# Patient Record
Sex: Male | Born: 1985 | Race: White | Hispanic: No | Marital: Single | Smoking: Never smoker
Health system: Southern US, Community
[De-identification: ages and names within clinical notes are randomized; demographics above are authoritative.]

## PROBLEM LIST (undated history)

## (undated) DIAGNOSIS — I340 Nonrheumatic mitral (valve) insufficiency: Secondary | ICD-10-CM

## (undated) DIAGNOSIS — Q231 Congenital insufficiency of aortic valve: Secondary | ICD-10-CM

## (undated) DIAGNOSIS — I080 Rheumatic disorders of both mitral and aortic valves: Secondary | ICD-10-CM

## (undated) HISTORY — DX: Rheumatic disorders of both mitral and aortic valves: I08.0

## (undated) HISTORY — DX: Congenital insufficiency of aortic valve: Q23.1

## (undated) HISTORY — PX: HERNIA REPAIR: SHX51

## (undated) HISTORY — DX: Nonrheumatic mitral (valve) insufficiency: I34.0

## (undated) HISTORY — PX: OTHER SURGICAL HISTORY: SHX169

---
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
## 2014-07-16 HISTORY — PX: APPENDECTOMY: SHX54

## 2016-12-21 ENCOUNTER — Encounter: Payer: Self-pay | Admitting: Cardiology

## 2016-12-25 DIAGNOSIS — Q249 Congenital malformation of heart, unspecified: Secondary | ICD-10-CM | POA: Insufficient documentation

## 2017-01-10 ENCOUNTER — Ambulatory Visit: Payer: Self-pay | Admitting: Cardiology

## 2017-02-22 ENCOUNTER — Ambulatory Visit (INDEPENDENT_AMBULATORY_CARE_PROVIDER_SITE_OTHER): Payer: BLUE CROSS/BLUE SHIELD | Admitting: Interventional Cardiology

## 2017-02-22 ENCOUNTER — Encounter: Payer: Self-pay | Admitting: Interventional Cardiology

## 2017-02-22 ENCOUNTER — Encounter (INDEPENDENT_AMBULATORY_CARE_PROVIDER_SITE_OTHER): Payer: Self-pay

## 2017-02-22 VITALS — BP 114/72 | HR 81 | Ht 71.0 in | Wt 177.2 lb

## 2017-02-22 DIAGNOSIS — I340 Nonrheumatic mitral (valve) insufficiency: Secondary | ICD-10-CM | POA: Diagnosis not present

## 2017-02-22 DIAGNOSIS — Q231 Congenital insufficiency of aortic valve: Secondary | ICD-10-CM

## 2017-02-22 DIAGNOSIS — Z8774 Personal history of (corrected) congenital malformations of heart and circulatory system: Secondary | ICD-10-CM | POA: Diagnosis not present

## 2017-02-22 HISTORY — DX: Nonrheumatic mitral (valve) insufficiency: I34.0

## 2017-02-22 HISTORY — DX: Congenital insufficiency of aortic valve: Q23.1

## 2017-02-22 NOTE — Progress Notes (Addendum)
Cardiology Office Note    Date:  02/22/2017   ID:  Edgar SimmersMatthew B Killmer, DOB 09/11/1985, MRN [id number]  PCP:  Farris HasMorrow, Aaron, MD  Cardiologist: Lesleigh NoeHenry W Smith III, MD   Chief Complaint  Patient presents with  . Follow-up    Coarctation of the aorta    History of Present Illness:  Edgar Dean is a 31 y.o. male referred by Dr. Farris HasAaron Morrow for establishment and long-term care with history of congenital Coarctation of the aorta repaired in 1989. 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  He is doing well. He has had intermittent follow-up after coarctation repair when he was 742-1/32 years old. He has no complaints. He is physically active. He has relocated back to [place] where he was born and raised. He was diagnosed and treated by Dr. Fletcher AnonStuart Schall. After graduating [employer], he has lived in [place] with his wife. He is establishing with cardiology to have routine follow-up. Last imaging was done in the [place] area at [facility] [address], [address], [place], [place] [id number]. He has a disc with a 2016 aortic MRI from that institution.  Past Medical History:  Diagnosis Date  . Rheumatic valve narrowing and leaking 1986   leaking valve - tricupsid    Past Surgical History:  Procedure Laterality Date  . APPENDECTOMY  2016  . HERNIA REPAIR     10 grade  . leaking valve - tricupsid (1986)      Current Medications: No outpatient prescriptions prior to visit.   No facility-administered medications prior to visit.      Allergies:   Patient has no known allergies.   Social History   Social History  . Marital status: Single    Spouse name: N/A  . Number of children: N/A  . Years of education: N/A   Social History Main Topics  . Smoking status: Never Smoker  . Smokeless tobacco: Never Used  . Alcohol use Yes     Comment: 5+ wine, beer or liquor  . Drug use: No   Comment: college  . Sexual activity: Not Asked   Other Topics Concern  . None   Social History Narrative  . None     Family History:  The patient's family history includes Healthy in his brother and father; Other in his mother.   ROS:   Please see the history of present illness.    No history of syncope, chest pain, leg fatigue, or neurological events. He has an occasional palpitation and nocturnal bilateral leg cramping in the calves. All other systems reviewed and are negative.   PHYSICAL EXAM:   VS:  BP 114/72 (BP Location: Left Arm)   Pulse 81   Ht 5\' 11"  (1.803 m)   Wt 177 lb 3.2 oz (80.4 kg)   BMI 24.71 kg/m    GEN: Well nourished, well developed, in no acute distress  HEENT: normal  Neck: no JVD, carotid bruits, or masses Cardiac: RRR; there is a 1/6 systolic  Murmur at the left lower sternal border. No rubs, or gallops,no edema .  Respiratory:  clear to auscultation bilaterally, normal work of breathing. Left lateral chest incision from the anterior axillary line to the scapula, well healed.  GI: soft, nontender, nondistended, + BS MS: no deformity or atrophy  Skin: warm and dry, no rash Neuro:  Alert and Oriented x 3, Strength and sensation are intact Psych: euthymic mood, full affect  Wt Readings from Last 3 Encounters:  02/22/17 177 lb 3.2 oz (80.4 kg)      Studies/Labs Reviewed:   EKG:  EKG  Normal sinus rhythm and overall normal appearance. Possibility of left atrial abnormality.  Recent Labs: No results found for requested labs within last 8760 hours.   Lipid Panel No results found for: CHOL, TRIG, HDL, CHOLHDL, VLDL, LDLCALC, LDLDIRECT  Additional studies/ records that were reviewed today include:   Will obtain recent imaging data from his physician in [place].  Blood pressure left arm 105/62 mmHg. Blood pressure right arm 120/70 mmHg.  Aortic MRA performed in [place] March 2016 demonstrated aortic root of 3.1 cm, abnormal morphology  of the large distal to the left subclavian ostium consistent with prior aortic coarctation repair.  Stress echo done April 2016 demonstrated 2 mm of inferolateral ST segment depression, upsloping, with normal LVEF during exercise and post exercise.  ASSESSMENT:    1. History of repair of coarctation of aorta   2. Bicuspid aortic valve   3. Non-rheumatic mitral regurgitation   4. Congenital heart anomaly      PLAN:  In order of problems listed above:  1. He is status post primary repair of aortic coarctation at age 32-1/2. He has been stable since that time. Secondary severe mitral regurgitation resolved to mild mitral regurgitation after successful repair. 2. Possible bicuspid aortic valve. He believes he has been told that before. We will await reports from prior echoes. 3. No significant mitral regurgitation is noted on exam.  1 year follow-up. Additional discussion at that time if any issues.  Medication Adjustments/Labs and Tests Ordered: Current medicines are reviewed at length with the patient today.  Concerns regarding medicines are outlined above.  Medication changes, Labs and Tests ordered today are listed in the Patient Instructions below. Patient Instructions  Medication Instructions:  None  Labwork: None  Testing/Procedures: None  Follow-Up: Your physician wants you to follow-up in: 1 year with Dr. Katrinka Blazing.  You will receive a reminder letter in the mail two months in advance. If you don't receive a letter, please call our office to schedule the follow-up appointment.   Any Other Special Instructions Will Be Listed Below (If Applicable).     If you need a refill on your cardiac medications before your next appointment, please call your pharmacy.      Signed, Lesleigh Noe, MD  02/22/2017 1:47 PM    [facility] [address] [place], [place], [place]  [address] Phone: [phone]; Fax: [phone]

## 2017-02-22 NOTE — Patient Instructions (Signed)

## 2017-03-24 ENCOUNTER — Encounter (HOSPITAL_COMMUNITY): Payer: Self-pay

## 2017-03-24 ENCOUNTER — Emergency Department (HOSPITAL_COMMUNITY)
Admission: EM | Admit: 2017-03-24 | Discharge: 2017-03-24 | Disposition: A | Discharge status: Home / Self Care | Payer: BLUE CROSS/BLUE SHIELD | Attending: Emergency Medicine | Admitting: Emergency Medicine

## 2017-03-24 ENCOUNTER — Emergency Department (HOSPITAL_COMMUNITY): Payer: BLUE CROSS/BLUE SHIELD

## 2017-03-24 DIAGNOSIS — Y998 Other external cause status: Secondary | ICD-10-CM | POA: Diagnosis not present

## 2017-03-24 DIAGNOSIS — W0110XA Fall on same level from slipping, tripping and stumbling with subsequent striking against unspecified object, initial encounter: Secondary | ICD-10-CM | POA: Insufficient documentation

## 2017-03-24 DIAGNOSIS — Y9389 Activity, other specified: Secondary | ICD-10-CM | POA: Insufficient documentation

## 2017-03-24 DIAGNOSIS — S43014A Anterior dislocation of right humerus, initial encounter: Secondary | ICD-10-CM | POA: Insufficient documentation

## 2017-03-24 DIAGNOSIS — Z8774 Personal history of (corrected) congenital malformations of heart and circulatory system: Secondary | ICD-10-CM | POA: Diagnosis not present

## 2017-03-24 DIAGNOSIS — S4991XA Unspecified injury of right shoulder and upper arm, initial encounter: Secondary | ICD-10-CM | POA: Diagnosis present

## 2017-03-24 DIAGNOSIS — Y92099 Unspecified place in other non-institutional residence as the place of occurrence of the external cause: Secondary | ICD-10-CM | POA: Insufficient documentation

## 2017-03-24 IMAGING — DX DG SHOULDER 2+V*R*
3 series · 3 of 3 positions shown · non-contrast
Comparison: None.

CLINICAL DATA: Right shoulder pain after fall.

EXAM:
RIGHT SHOULDER - 2+ VIEW

[shoulder grashey]
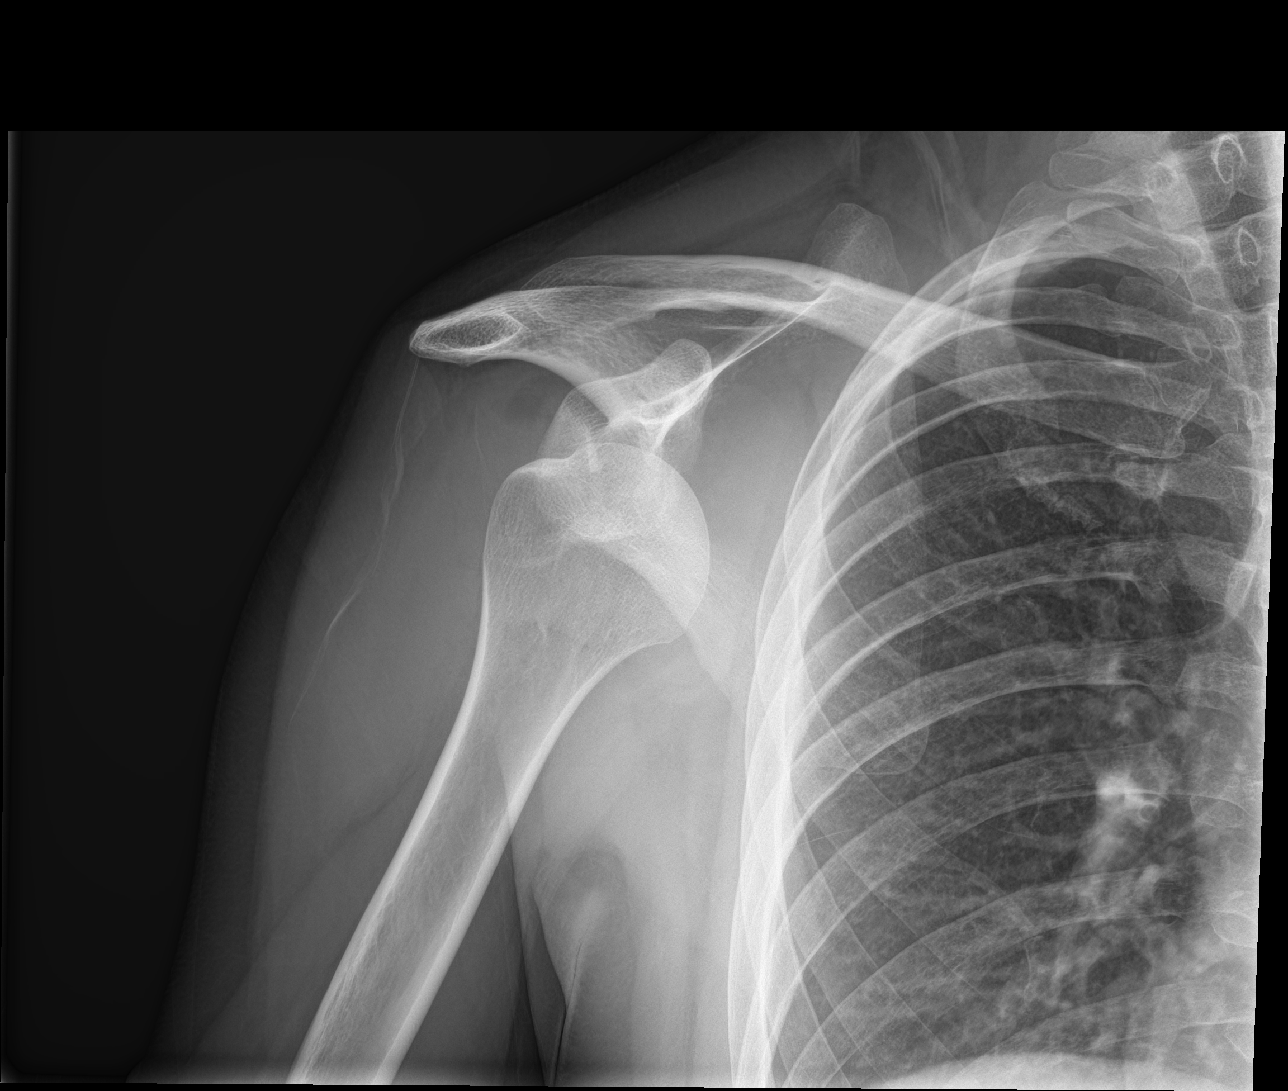

[shoulder y-view (1 of 2)]
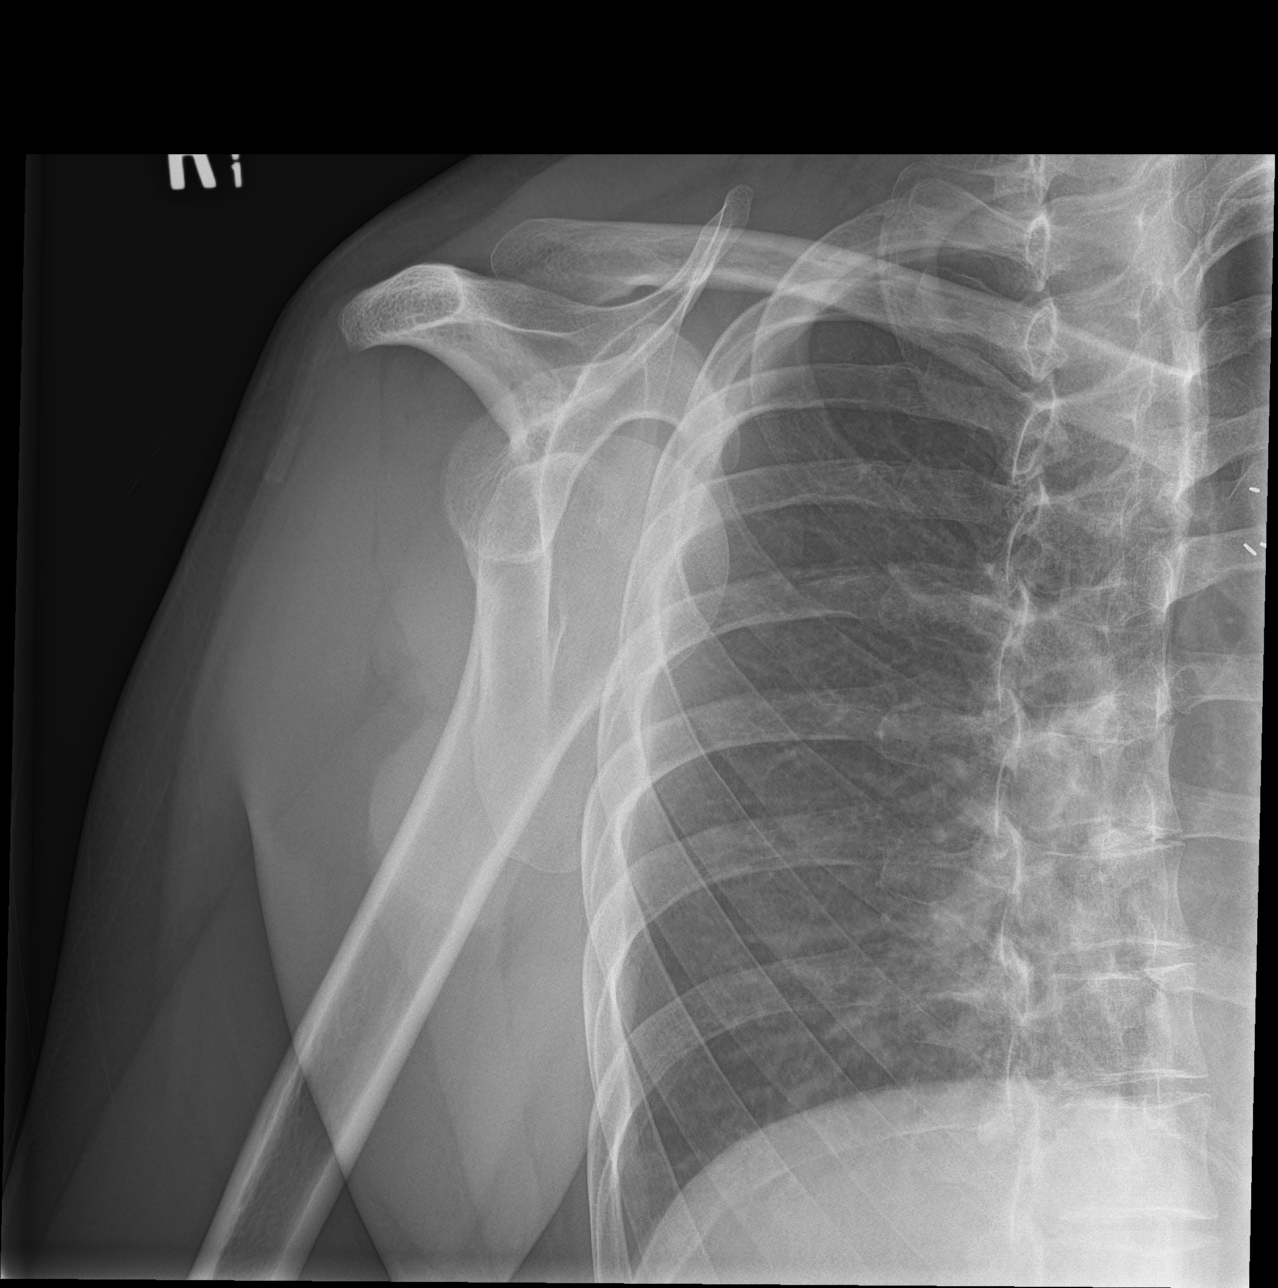

[shoulder y-view (2 of 2)]
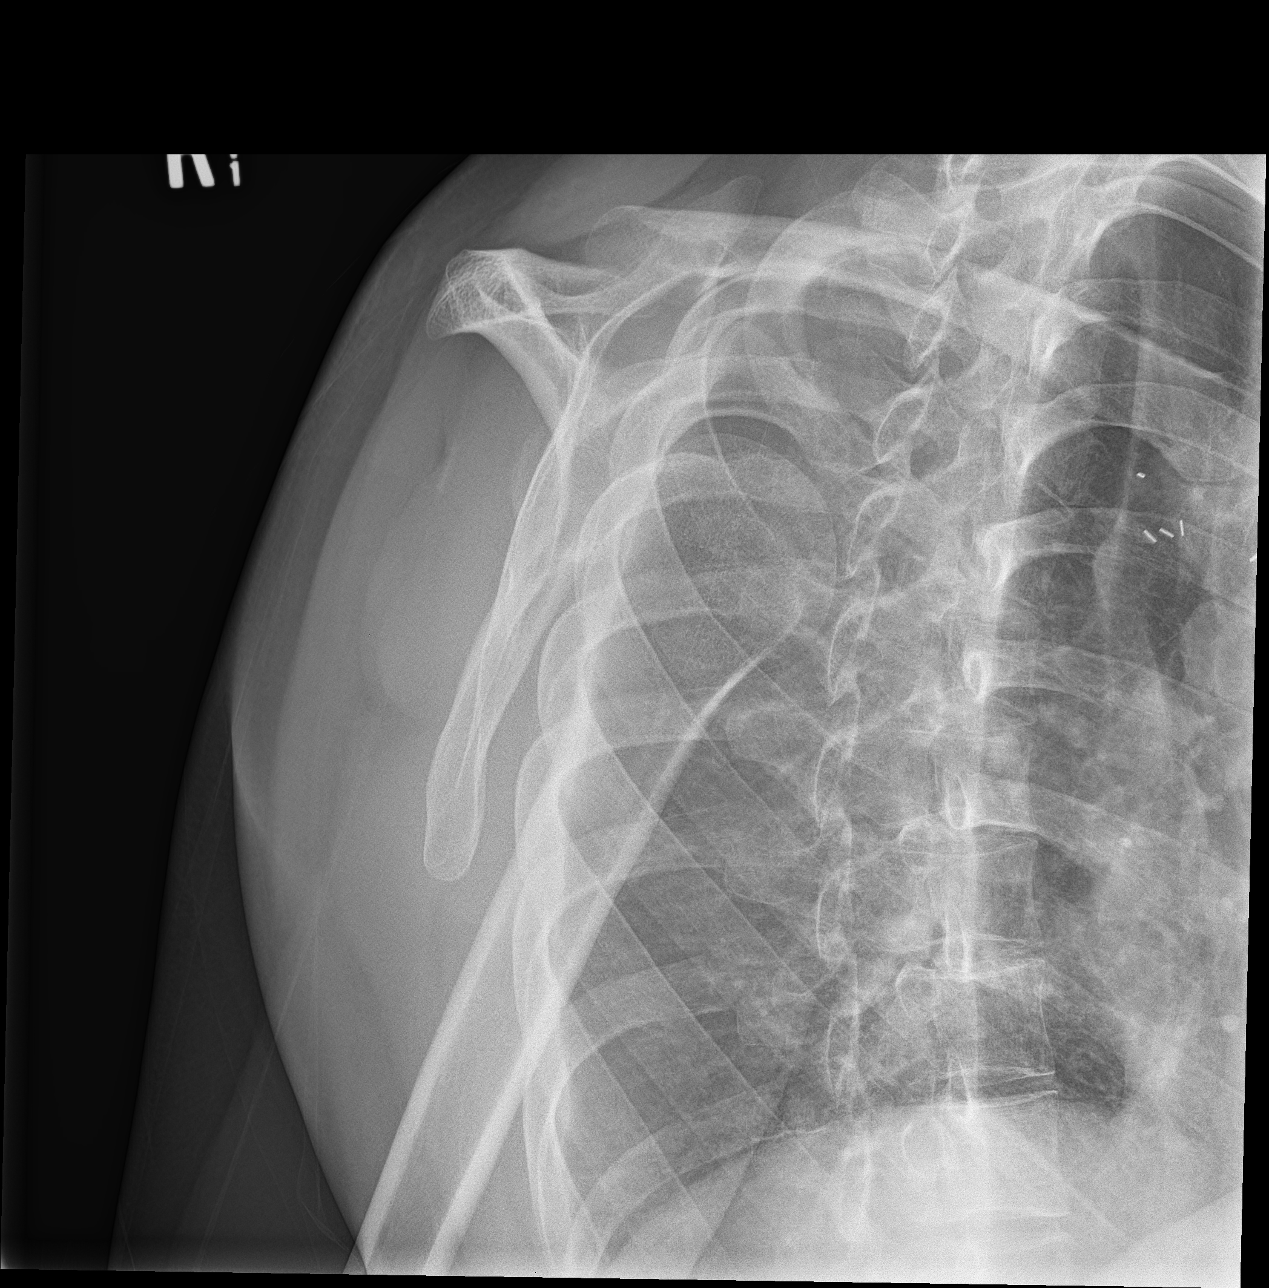

[3 of 3 positions shown; findings below may reference images not displayed]

FINDINGS: There is no evidence of fracture. Anterior dislocation of proximal
humeral head is noted. There is no evidence of arthropathy or other
focal bone abnormality. Soft tissues are unremarkable.
IMPRESSION: Anterior dislocation of proximal right humeral head.

## 2017-03-24 IMAGING — CR DG SHOULDER 2+V*R*
2 series · 2 of 2 positions shown · non-contrast
Comparison: Earlier today

CLINICAL DATA: Postreduction right shoulder films. Initial
encounter.

EXAM:
RIGHT SHOULDER - 2+ VIEW

[shoulder grashey]
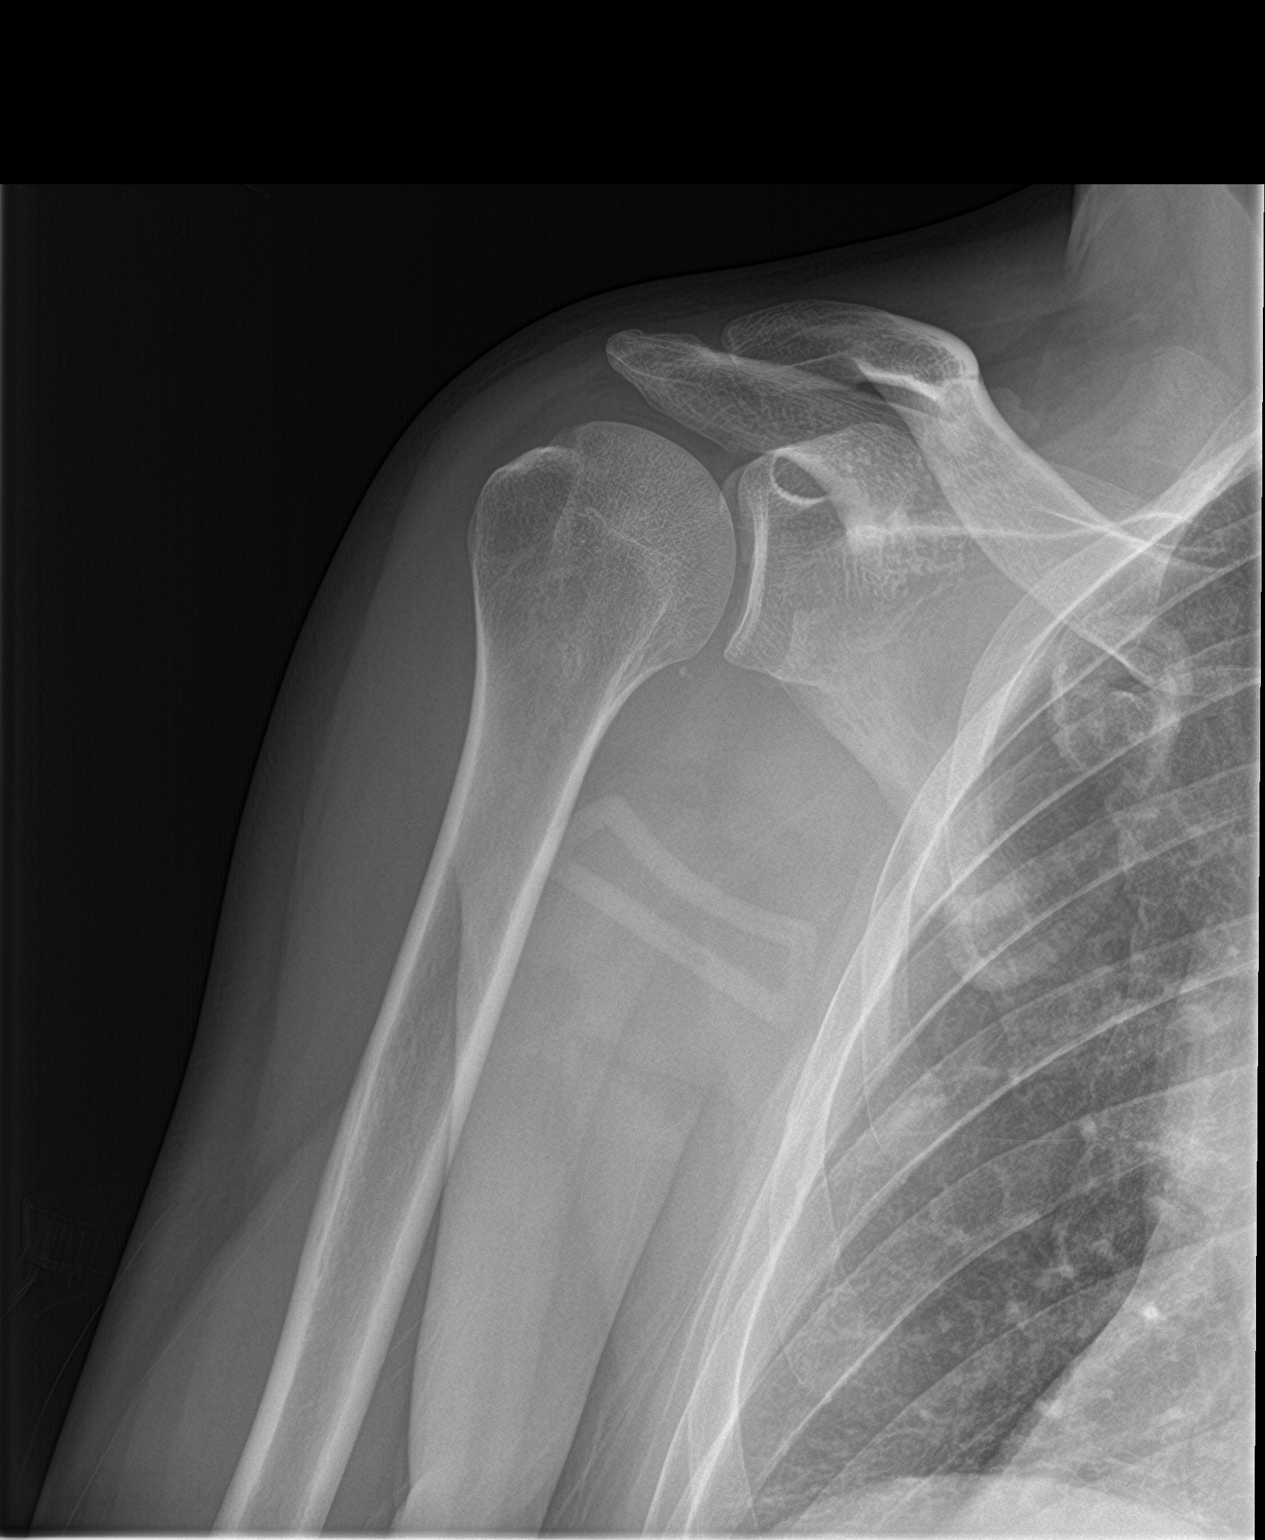

[shoulder y view]
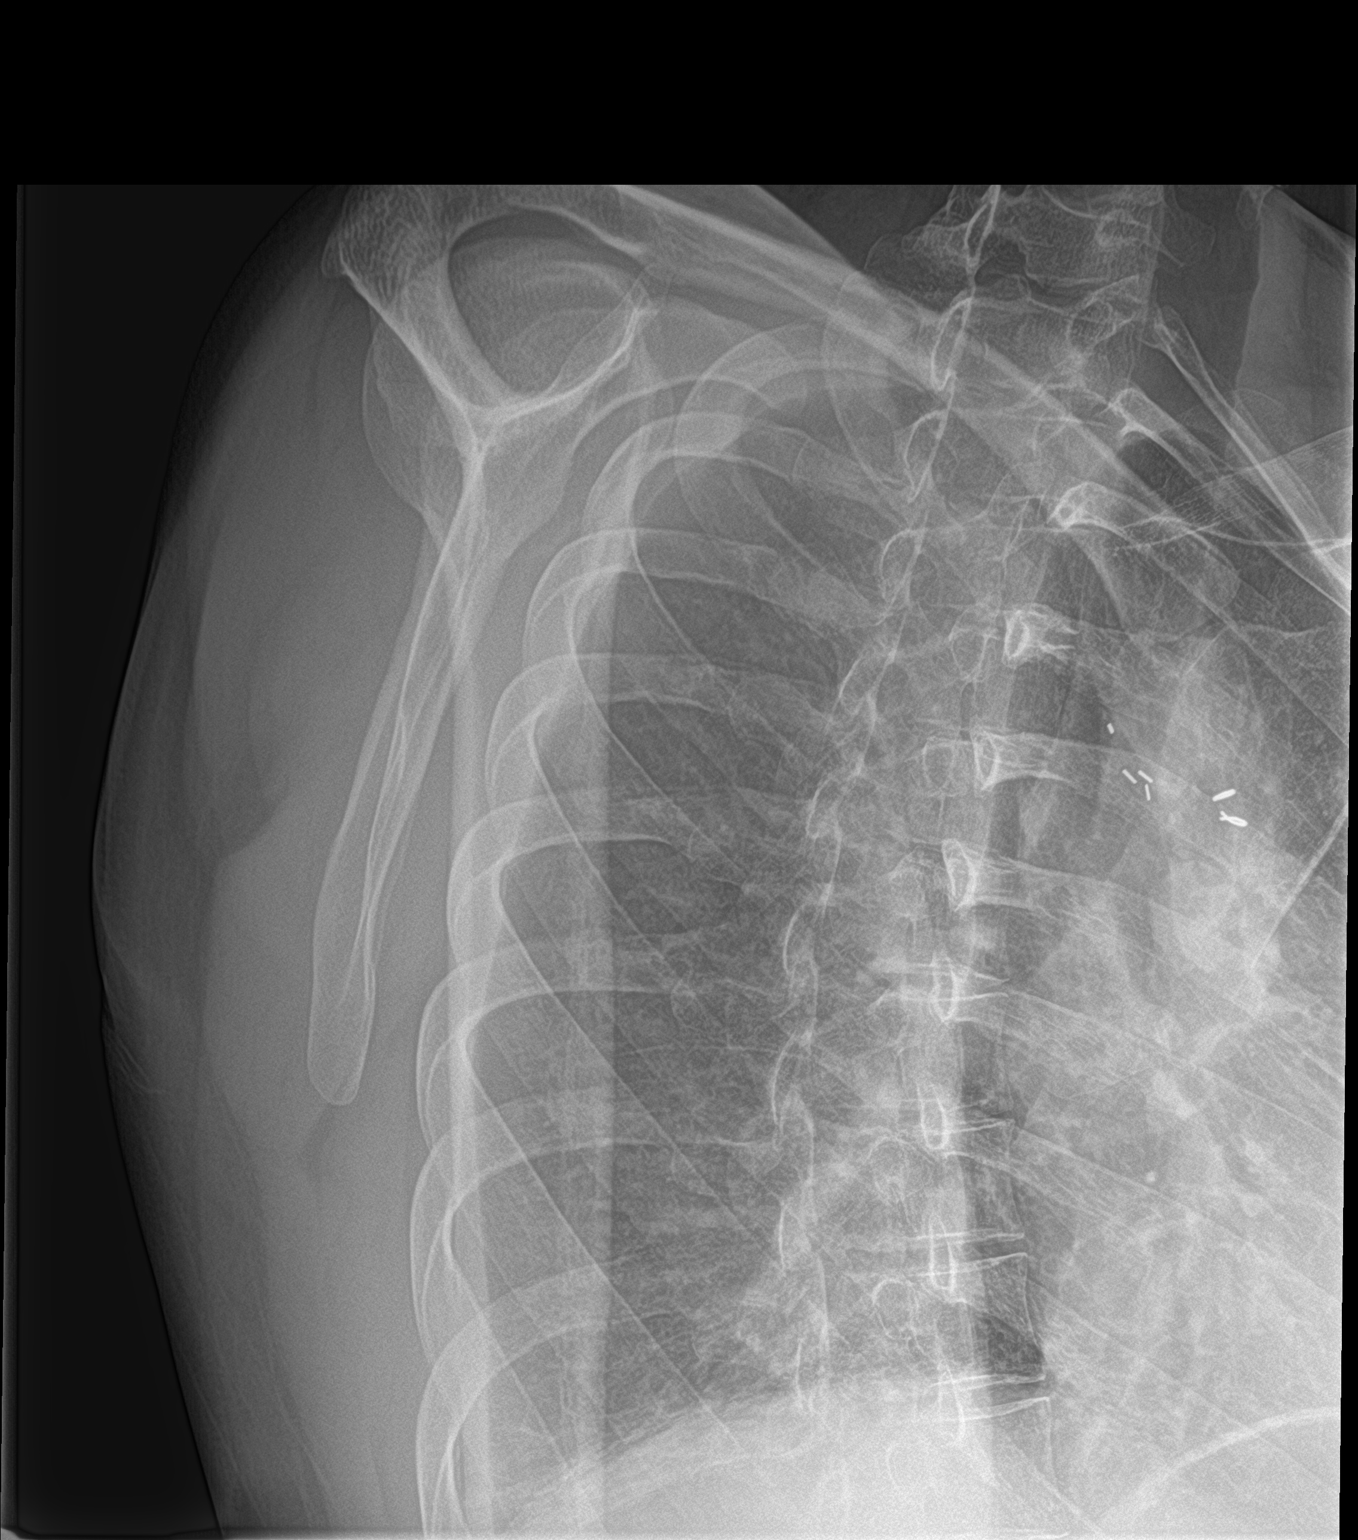

[2 of 2 positions shown; findings below may reference images not displayed]

FINDINGS: Right glenohumeral joint has been relocated. Hill-Sachs deformity is
present. Normal alignment at the acromioclavicular joint. There are
2 tiny radiodensities overlapping the inferior glenohumeral recess
without visible donor site.

Clips seen over the mediastinum; history of aortic coarctation
repair.
IMPRESSION: 1. Reduced glenohumeral joint.
2. Hill-Sachs deformity.

## 2017-03-24 MED ORDER — IBUPROFEN 800 MG PO TABS: 800.0000 mg | ORAL_TABLET | Freq: Three times a day (TID) | ORAL | 0 refills | Status: AC

## 2017-03-24 MED ORDER — MORPHINE SULFATE (PF) 4 MG/ML IV SOLN
6.0000 mg | Freq: Once | INTRAVENOUS | Status: AC
  Administered 2017-03-24: 6 mg via INTRAVENOUS
  Filled 2017-03-24: qty 2

## 2017-03-24 MED ORDER — MORPHINE SULFATE (PF) 4 MG/ML IV SOLN: 4.0000 mg | Freq: Once | INTRAVENOUS | Status: DC

## 2017-03-24 MED ORDER — OXYCODONE-ACETAMINOPHEN 5-325 MG PO TABS: 1.0000 | ORAL_TABLET | Freq: Three times a day (TID) | ORAL | 0 refills | Status: AC | PRN

## 2017-03-24 NOTE — ED Provider Notes (Signed)
[facility] DEPT Provider Note   CSN: [id number] Arrival date & time: 03/24/17  1409   History   Chief Complaint No chief complaint on file.  HPI  Edgar Dean is a 31yo male with PMH significant for congenital coarctation of aorta s/p repair in 1989 who presents with anterior dislocation of right shoulder. He states he was carrying some heavy bags at home when he tripped and fell, landing on his right shoulder. Denies head trauma or LOC. Has pain with any ROM and is 7/10 in severity. Has not taken any medications for the pain. Denies chest pain, trouble breathing, pain in other extremities, fevers, or dizziness. Has never had prior dislocation of extremities.  Denies smoking, drinks 3-4 glasses of alcohol on weekends, and occasional marijuana use.  Past Medical History:  Diagnosis Date  . Bicuspid aortic valve 02/22/2017  . Non-rheumatic mitral regurgitation 02/22/2017  . Rheumatic valve narrowing and leaking 1986   leaking valve - tricupsid   Patient Active Problem List   Diagnosis Date Noted  . History of repair of coarctation of aorta 02/22/2017  . Bicuspid aortic valve 02/22/2017  . Non-rheumatic mitral regurgitation 02/22/2017    Past Surgical History:  Procedure Laterality Date  . APPENDECTOMY  2016  . HERNIA REPAIR     10 grade  . leaking valve - tricupsid (1986)      Home Medications    Prior to Admission medications   Medication Sig Start Date End Date Taking? Authorizing Provider  ibuprofen (ADVIL,MOTRIN) 800 MG tablet Take 1 tablet (800 mg total) by mouth 3 (three) times daily. 03/24/17 03/27/17  Scherrie Gerlach, MD  oxyCODONE-acetaminophen (ROXICET) 5-325 MG tablet Take 1 tablet by mouth every 8 (eight) hours as needed for severe pain. 03/24/17 03/26/17  Scherrie Gerlach, MD    Family History Family History  Problem Relation Age of Onset  . Other Mother        non TB micro bacteria  . Healthy Father   . Healthy Brother     Social History Social History    Substance Use Topics  . Smoking status: Never Smoker  . Smokeless tobacco: Never Used  . Alcohol use Yes     Comment: 5+ wine, beer or liquor     Allergies   Patient has no known allergies.  Review of Systems Review of Systems  Unremarkable except as stated above in HPI.  Physical Exam Updated Vital Signs BP 121/73   Pulse 65   Temp 98.2 F (36.8 C) (Oral)   Resp 14   Ht  (1.803 m)   Wt 79.8 kg (176 lb)   SpO2 99%   BMI 24.55 kg/m   Physical Exam GEN: Well-appearing, young male in NAD; alert and oriented. HENT: Moist mucous membranes. No visible lesions. EYES: EOMI. Sclera anicteric. ARM: Right shoulder externally rotated. No decrease in sensation in RUE. Pain with any ROM of right shoulder. 2+ radial pulses. Able to wiggle fingers. RESP: Clear to auscultation bilaterally. No wheezes, rales, or rhonchi. CV: Normal rate and regular rhythm. Faint systolic murmur. No LE edema. ABD: Soft. Non-tender. Non-distended. Normoactive bowel sounds. EXT: No edema. 2+ DP pulses.  ED Treatments / Results  Labs (all labs ordered are listed, but only abnormal results are displayed) Labs Reviewed - No data to display  EKG  EKG Interpretation None       Radiology Dg Shoulder Right  Result Date: 03/24/2017 CLINICAL DATA:  Postreduction right shoulder films. Initial encounter. EXAM: RIGHT SHOULDER - 2+  VIEW COMPARISON:  Earlier today FINDINGS: Right glenohumeral joint has been relocated. Hill-Sachs deformity is present. Normal alignment at the acromioclavicular joint. There are 2 tiny radiodensities overlapping the inferior glenohumeral recess without visible donor site. Clips seen over the mediastinum; history of aortic coarctation repair. IMPRESSION: 1. Reduced glenohumeral joint. 2. Hill-Sachs deformity. Electronically Signed   By: Marnee SpringJonathon  Watts M.D.   On: 03/24/2017 18:45   Dg Shoulder Right  Result Date: 03/24/2017 CLINICAL DATA:  Right shoulder pain after fall. EXAM:  RIGHT SHOULDER - 2+ VIEW COMPARISON:  None. FINDINGS: There is no evidence of fracture. Anterior dislocation of proximal humeral head is noted. There is no evidence of arthropathy or other focal bone abnormality. Soft tissues are unremarkable. IMPRESSION: Anterior dislocation of proximal right humeral head. Electronically Signed   By: Lupita RaiderJames  Green Jr, M.D.   On: 03/24/2017 15:00    Procedures Procedures (including critical care time)  Medications Ordered in ED Medications  morphine 4 MG/ML injection 6 mg (6 mg Intravenous Given 03/24/17 1652)     Initial Impression / Assessment and Plan / ED Course  I have reviewed the triage vital signs and the nursing notes.  Pertinent labs & imaging results that were available during my care of the patient were reviewed by me and considered in my medical decision making (see chart for details).  Edgar Dean is a 31yo male with PMH of primary repair of aortic coarctation in 1989 who presents with anterior right shoulder dislocation after tripping and falling at home. Denies head trauma or LOC. No other signs of injury. Hemodynamically stable. X-ray confirms anterior dislocation of proximal right humeral head. Will give morphine 6mg  and will need reduction.  5:29pm Patient received IV morphine 6mg . We were able to reduce the shoulder without sedation. He tolerated the procedure well. He was placed in a sling after the procedure and repeat x-ray of right shoulder showed reduced glenohumeral joint and Hill-Sachs deformity.  6:55pm He is stable for discharge, and will be discharged with ibuprofen, Percocet PRN, and follow-up with Dr. Charlann Boxerlin in Orthopedics in 1 week.  Patient seen and discussed with Dr. Clayborne DanaMesner.  Final Clinical Impressions(s) / ED Diagnoses   Final diagnoses:  Anterior shoulder dislocation, right, initial encounter    New Prescriptions New Prescriptions   IBUPROFEN (ADVIL,MOTRIN) 800 MG TABLET    Take 1 tablet (800 mg total) by mouth 3  (three) times daily.   OXYCODONE-ACETAMINOPHEN (ROXICET) 5-325 MG TABLET    Take 1 tablet by mouth every 8 (eight) hours as needed for severe pain.     Scherrie GerlachHuang, Trenice Mesa, MD 03/24/17 Emelda [id number]    Marily MemosMesner, Jason, MD 03/25/17 [id number]

## 2017-03-24 NOTE — ED Notes (Signed)
Pt understood dc material. NAD note

## 2017-03-24 NOTE — ED Provider Notes (Signed)
  Physical Exam  BP 121/73   Pulse 65   Temp 98.2 F (36.8 C) (Oral)   Resp 14   Ht 5\' 11"  (1.803 m)   Wt 79.8 kg (176 lb)   SpO2 99%   BMI 24.55 kg/m   Physical Exam  Constitutional: He is oriented to person, place, and time. He appears well-developed and well-nourished.  HENT:  Head: Normocephalic and atraumatic.  Eyes: Conjunctivae and EOM are normal.  Neck: Normal range of motion.  Cardiovascular: Normal rate.   Pulmonary/Chest: Effort normal. No respiratory distress.  Abdominal: Soft. He exhibits no distension.  Musculoskeletal: Normal range of motion. He exhibits tenderness and deformity (right shoulder).  Neurological: He is alert and oriented to person, place, and time. No cranial nerve deficit. Coordination normal.  Skin: Skin is warm and dry.  Nursing note and vitals reviewed.   ED Course  ORTHOPEDIC INJURY TREATMENT Date/Time: 03/24/2017 6:04 PM Performed by: Marily MemosMESNER, Lexxi Koslow Authorized by: Marily MemosMESNER, Infant Doane  Consent: Verbal consent obtained. Risks and benefits: risks, benefits and alternatives were discussed Consent given by: patient Patient identity confirmed: verbally with patient Injury location: shoulder Location details: right shoulder Injury type: dislocation Dislocation type: anterior Hill-Sachs deformity: no Chronicity: new Pre-procedure neurovascular assessment: neurovascularly intact Pre-procedure distal perfusion: normal Pre-procedure distal perfusion comment: good perfusion, not able to fully feel radial pulse under the scar tissue Pre-procedure neurological function: normal Pre-procedure range of motion: reduced  Anesthesia: Local anesthesia used: no  Sedation: Patient sedated: no Manipulation performed: yes Reduction method: external rotation Reduction successful: yes X-ray confirmed reduction: yes Immobilization: sling Post-procedure neurovascular assessment: post-procedure neurovascularly intact Post-procedure distal perfusion:  normal Post-procedure neurological function: normal Post-procedure range of motion: normal Patient tolerance: Patient tolerated the procedure well with no immediate complications    No results found for this or any previous visit. Dg Shoulder Right  Result Date: 03/24/2017 CLINICAL DATA:  Right shoulder pain after fall. EXAM: RIGHT SHOULDER - 2+ VIEW COMPARISON:  None. FINDINGS: There is no evidence of fracture. Anterior dislocation of proximal humeral head is noted. There is no evidence of arthropathy or other focal bone abnormality. Soft tissues are unremarkable. IMPRESSION: Anterior dislocation of proximal right humeral head. Electronically Signed   By: Lupita RaiderJames  Green Jr, M.D.   On: 03/24/2017 15:5300     MDM 31 year old male who fell on his right arm with sudden pain to his right shoulder and deformity secondary here for evaluation. Patient had anterior dislocation without any obvious fractures on x-ray. He had reduced right radial pulse likely secondary to scar tissue regular perfusion distally. He can move his hand and had good sensation distally as well. The patient was given a dose of pain medication and dislocation was successfully reduced by myself without sedation. Patient same neurovascular exam afterwards and repeat XR shows [facility], slinged and will follow up with ortho.        Rabon Scholle, Barbara CowerJason, MD 03/25/17 [phone]

## 2017-03-24 NOTE — ED Triage Notes (Signed)
Patient tripped and fell while carrying bags in home landing on right shoulder. Pain with any ROM. Positive distal pulses present

## 2017-03-24 NOTE — Discharge Instructions (Signed)
Your right shoulder was dislocated and we were able to put it back in place.  - Please follow up with Dr. Charlann Boxerlin (Orthopedics) in one week. - Please take ibuprofen 800mg  three times a day for three days for pain, and then as needed every 8 hours after that. - Please take Roxicet 5-325 every 8 hours if needed for severe pain. Once you no longer need the Roxicet for pain, you can bring them back to the pharmacy to be discarded.

## 2018-10-16 ENCOUNTER — Telehealth: Payer: Self-pay | Admitting: Interventional Cardiology

## 2018-10-16 NOTE — Telephone Encounter (Signed)
His heart condition was successfully treated /repaired when he was 68.34 years old. I don't believe his risk is much different than others in his age group.

## 2018-10-16 NOTE — Telephone Encounter (Signed)
Spoke with pt and made him aware of info provided by Dr. Katrinka Blazing. Pt verbalized understanding.

## 2018-10-16 NOTE — Telephone Encounter (Addendum)
Spoke with pt and advised that I am waiting to hear back from Dr. Katrinka Blazing.  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 States he is unable to follow the current suggested guidelines of social distancing.  This is concerning for him due to his heart issues and that people his age with underlying heart conditions are passing away from COVID 19.  Pt last seen in the office 02/2017 and thought he was told to f/u in 2 years so that's why he hasn't been back in.  Pt wants to know if Dr. Katrinka Blazing feels it is appropriate for him to have a letter stating to take a LOA for 6 weeks due to his cardiac condition. Advised I would pass this information on to Dr. Katrinka Blazing for review.

## 2018-10-16 NOTE — Telephone Encounter (Signed)
New Message   Patient is calling to request a letter so that he can take a leave of absence from his job because of his heart condition. He states that he is considered to be high risk. Please call to discuss

## 2018-10-16 NOTE — Telephone Encounter (Signed)
Follow up:   Patient called back to see why no one has called him back to speak with him concerning this matter. Please call patient back.

## 2018-10-16 NOTE — Telephone Encounter (Signed)
We are not giving out letters correct?  Just advising to follow proper precautions?

## 2019-02-27 ENCOUNTER — Ambulatory Visit: Payer: BLUE CROSS/BLUE SHIELD | Admitting: Interventional Cardiology

## 2019-05-18 ENCOUNTER — Ambulatory Visit: Payer: BLUE CROSS/BLUE SHIELD | Admitting: Interventional Cardiology

## 2019-06-16 ENCOUNTER — Other Ambulatory Visit: Payer: Self-pay

## 2019-06-16 DIAGNOSIS — Z20822 Contact with and (suspected) exposure to covid-19: Secondary | ICD-10-CM

## 2019-06-18 LAB — NOVEL CORONAVIRUS, NAA: SARS-CoV-2, NAA: NOT DETECTED

## 2019-08-31 ENCOUNTER — Ambulatory Visit: Payer: Self-pay | Admitting: Interventional Cardiology

## 2019-12-31 NOTE — Progress Notes (Deleted)
Cardiology Office Note:    Date:  12/31/2019   ID:  Edgar Dean, DOB [birth year], MRN [id number]  PCP:  London Pepper, MD  Cardiologist:  Sinclair Grooms, MD   Referring MD: London Pepper, MD   No chief complaint on file.   History of Present Illness:    Edgar Dean is a 34 y.o. male with a hx of congenital Coarctation of the aorta repaired in 1989. 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  ***  Past Medical History:  Diagnosis Date  . Bicuspid aortic valve 02/22/2017  . Non-rheumatic mitral regurgitation 02/22/2017  . Rheumatic valve narrowing and leaking 1986   leaking valve - tricupsid    Past Surgical History:  Procedure Laterality Date  . APPENDECTOMY  2016  . HERNIA REPAIR     10 grade  . leaking valve - tricupsid (1986)      Current Medications: No outpatient medications have been marked as taking for the 01/04/20 encounter (Appointment) with Belva Crome, MD.     Allergies:   Patient has no known allergies.   Social History   Socioeconomic History  . Marital status: Single    Spouse name: Not on file  . Number of children: Not on file  . Years of education: Not on file  . Highest education level: Not on file  Occupational History  . Not on file  Tobacco Use  . Smoking status: Never Smoker  . Smokeless tobacco: Never Used  Substance and Sexual Activity  . Alcohol use: Yes    Comment: 5+ wine, beer or liquor  . Drug use: No    Types: Marijuana    Comment: college  . Sexual activity: Not on file  Other Topics Concern  . Not on file  Social History Narrative  . Not on file   Social Determinants of Health   Financial Resource Strain:   . Difficulty of Paying Living Expenses:   Food Insecurity:   . Worried About Charity fundraiser in the Last Year:   . Arboriculturist in the Last Year:   Transportation Needs:   . Film/video editor (Medical):   Marland Kitchen Lack of  Transportation (Non-Medical):   Physical Activity:   . Days of Exercise per Week:   . Minutes of Exercise per Session:   Stress:   . Feeling of Stress :   Social Connections:   . Frequency of Communication with Friends and Family:   . Frequency of Social Gatherings with Friends and Family:   . Attends Religious Services:   . Active Member of Clubs or Organizations:   . Attends Archivist Meetings:   Marland Kitchen Marital Status:      Family History: The patient's family history includes Healthy in his brother and father; Other in his mother.  ROS:   Please see the history of present illness.    *** All other systems reviewed and are negative.  EKGs/Labs/Other Studies Reviewed:    The following studies were reviewed today: ***  EKG:  EKG ***  Recent Labs: No results found for requested labs within last 8760 hours.  Recent Lipid Panel No results found for: CHOL, TRIG, HDL, CHOLHDL, VLDL, LDLCALC, LDLDIRECT  Physical Exam:    VS:  There were no vitals taken for this visit.    Wt Readings from Last 3 Encounters:  03/24/17 176 lb (79.8 kg)  02/22/17 177 lb 3.2 oz (80.4 kg)  GEN: ***. No acute distress HEENT: Normal NECK: No JVD. LYMPHATICS: No lymphadenopathy CARDIAC: *** RRR without murmur, gallop, or edema. VASCULAR: *** Normal Pulses. No bruits. RESPIRATORY:  Clear to auscultation without rales, wheezing or rhonchi  ABDOMEN: Soft, non-tender, non-distended, No pulsatile mass, MUSCULOSKELETAL: No deformity  SKIN: Warm and dry NEUROLOGIC:  Alert and oriented x 3 PSYCHIATRIC:  Normal affect   ASSESSMENT:    1. Bicuspid aortic valve   2. Non-rheumatic mitral regurgitation   3. History of repair of coarctation of aorta   4. Educated about COVID-19 virus infection    PLAN:    In order of problems listed above:  1. Needs cardiac and aortic MRA and MRI to evaluated coarctation repair site and mitral/aortic valve function. 2. 2- D doppler  echo. 3. ***   Medication Adjustments/Labs and Tests Ordered: Current medicines are reviewed at length with the patient today.  Concerns regarding medicines are outlined above.  No orders of the defined types were placed in this encounter.  No orders of the defined types were placed in this encounter.   There are no Patient Instructions on file for this visit.   Signed, Lesleigh Noe, MD  12/31/2019 3:26 PM    [facility]

## 2020-01-04 ENCOUNTER — Ambulatory Visit: Payer: Self-pay | Admitting: Interventional Cardiology

## 2020-02-29 NOTE — Progress Notes (Deleted)
CARDIOLOGY OFFICE NOTE  Date:  02/29/2020    Edgar Dean Date of Birth: 1986/03/12 Medical Record #[id number]  PCP:  Edgar Has, MD  Cardiologist:  Tyrone Sage & ***    No chief complaint on file.   History of Present Illness: Edgar Dean is a 34 y.o. male who presents today for a ***    Comes in today. Here with   Past Medical History:  Diagnosis Date  . Bicuspid aortic valve 02/22/2017  . Non-rheumatic mitral regurgitation 02/22/2017  . Rheumatic valve narrowing and leaking 1986   leaking valve - tricupsid    Past Surgical History:  Procedure Laterality Date  . APPENDECTOMY  2016  . HERNIA REPAIR     10 grade  . leaking valve - tricupsid (1986)       Medications: No outpatient medications have been marked as taking for the 03/09/20 encounter (Appointment) with Rosalio Macadamia, NP.     Allergies: No Known Allergies  Social History: The patient  reports that he Dean never smoked. He Dean never used smokeless tobacco. He reports current alcohol use. He reports that he does not use drugs.   Family History: The patient's ***family history includes Healthy in his brother and father; Other in his mother.   Review of Systems: Please see the history of present illness.   All other systems are reviewed and negative.   Physical Exam: VS:  There were no vitals taken for this visit. Marland Kitchen  BMI There is no height or weight on file to calculate BMI.  Wt Readings from Last 3 Encounters:  03/24/17 176 lb (79.8 kg)  02/22/17 177 lb 3.2 oz (80.4 kg)    General: Pleasant. Well developed, well nourished and in no acute distress.   HEENT: Normal.  Neck: Supple, no JVD, carotid bruits, or masses noted.  Cardiac: ***Regular rate and rhythm. No murmurs, rubs, or gallops. No edema.  Respiratory:  Lungs are clear to auscultation bilaterally with normal work of breathing.  GI: Soft and nontender.  MS: No deformity or atrophy. Gait and ROM intact.  Skin: Warm and  dry. Color is normal.  Neuro:  Strength and sensation are intact and no gross focal deficits noted.  Psych: Alert, appropriate and with normal affect.   LABORATORY DATA:  EKG:  EKG {ACTION; IS/IS [id number]} ordered today.  Personally reviewed by me. This demonstrates ***.  No results found for: WBC, HGB, HCT, PLT, GLUCOSE, CHOL, TRIG, HDL, LDLDIRECT, LDLCALC, ALT, AST, NA, K, CL, CREATININE, BUN, CO2, TSH, PSA, INR, GLUF, HGBA1C, MICROALBUR   BNP (last 3 results) No results for input(s): BNP in the last 8760 hours.  ProBNP (last 3 results) No results for input(s): PROBNP in the last 8760 hours.   Other Studies Reviewed Today:   Assessment/Plan:    Current medicines are reviewed with the patient today.  The patient does not have concerns regarding medicines other than what Dean been noted above.  The following changes have been made:  See above.  Labs/ tests ordered today include:   No orders of the defined types were placed in this encounter.    Disposition:   FU with *** in {gen number [id number]} {Days to years:10300}.   Patient is agreeable to this plan and will call if any problems develop in the interim.   SignedNorma Fredrickson, NP  02/29/2020 7:43 AM  [facility] [address] [place], [place]  [address] Phone: [phone]  Fax: [phone]

## 2020-03-09 ENCOUNTER — Ambulatory Visit: Payer: Self-pay | Admitting: Nurse Practitioner

## 2020-03-11 ENCOUNTER — Encounter: Payer: Self-pay | Admitting: Interventional Cardiology

## 2020-03-11 ENCOUNTER — Ambulatory Visit: Payer: BC Managed Care – PPO | Admitting: Interventional Cardiology

## 2020-03-11 ENCOUNTER — Other Ambulatory Visit: Payer: Self-pay

## 2020-03-11 VITALS — BP 114/78 | HR 72 | Ht 71.0 in | Wt 174.2 lb

## 2020-03-11 DIAGNOSIS — E785 Hyperlipidemia, unspecified: Secondary | ICD-10-CM | POA: Diagnosis not present

## 2020-03-11 DIAGNOSIS — R0789 Other chest pain: Secondary | ICD-10-CM | POA: Diagnosis not present

## 2020-03-11 DIAGNOSIS — Q251 Coarctation of aorta: Secondary | ICD-10-CM

## 2020-03-11 NOTE — Patient Instructions (Signed)

## 2020-03-11 NOTE — Progress Notes (Signed)
Cardiology Office Note   Date:  03/11/2020   ID:  Edgar Dean, DOB 05-Jul-1986, MRN [id number]  PCP:  Farris Has, MD    No chief complaint on file.  H/o coarctation of the aorta  Wt Readings from Last 3 Encounters:  03/11/20 174 lb 3.2 oz (79 kg)  03/24/17 176 lb (79.8 kg)  02/22/17 177 lb 3.2 oz (80.4 kg)       History of Present Illness: Edgar Dean is a 34 y.o. male who is being seen today for the evaluation of coarctation of the aorta at the request of Farris Has, MD.  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  Records show: "He has had intermittent follow-up after coarctation repair when he was 52-1/35 years old. He has no complaints. He is physically active. He has relocated back to [place] where he was born and raised. He was diagnosed and treated by Dr. Fletcher Anon. After graduating [employer], he has lived in [place] with his wife. He is establishing with cardiology to have routine follow-up. Last imaging was done in the [place] area at [facility] [address], [address], [place], [place] [address]. He has a disc with a 2016 aortic MRI from that institution.                                                                         Aortic MRA performed in [place] March 2016 demonstrated aortic root of 3.1 cm, abnormal morphology of the large distal to the left subclavian ostium consistent with prior aortic coarctation repair.  Stress echo done April 2016 demonstrated 2 mm of inferolateral ST segment depression, upsloping, with normal LVEF during exercise and post exercise."  He has not been seen since 2018.  A few few months ago, he had some persistent chest in his chest and it resolved after a few days.  No recurrence.  Not related to activity.    He got his vaccines against  COVID.  He does geriatric care management.  Activity has decreased with COVID.  Not going to gym anymore.  He does hikes for several miles at a time.        Past Medical History:  Diagnosis Date  . Bicuspid aortic valve 02/22/2017  . Non-rheumatic mitral regurgitation 02/22/2017  . Rheumatic valve narrowing and leaking 1986   leaking valve - tricupsid    Past Surgical History:  Procedure Laterality Date  . APPENDECTOMY  2016  . HERNIA REPAIR     10 grade  . leaking valve - tricupsid (1986)       No current outpatient medications on file.   No current facility-administered medications for this visit.    Allergies:   Patient has no known allergies.    Social History:  The patient  reports that he has never smoked. He has never used smokeless tobacco. He reports current alcohol use. He reports that he does not use drugs. rare MJ use  Family History:  The patient's *family history includes Healthy in his brother and father; Other in his mother.  ROS:  Please see the history of present illness.   Otherwise, review of systems are positive for walking less of late.   All other systems are reviewed and negative.    PHYSICAL EXAM: VS:  BP 114/78   Pulse 72   Ht 5\' 11"  (1.803 m)   Wt 174 lb 3.2 oz (79 kg)   SpO2 97%   BMI 24.30 kg/m  , BMI Body mass index is 24.3 kg/m. GEN: Well nourished, well developed, in no acute distress  HEENT: normal  Neck: no JVD, carotid bruits, or masses Cardiac: RRR; no murmurs, rubs, or gallops,no edema  Respiratory:  clear to auscultation bilaterally, normal work of breathing GI: soft, nontender, nondistended, + BS MS: no deformity or atrophy  Skin: warm and dry, no rash Neuro:  Strength and sensation are intact Psych: euthymic mood, full affect   EKG:   The ekg ordered today demonstrates Normal ECG   Recent Labs: No results found for requested labs within last 8760 hours.   Lipid Panel No results found for: CHOL, TRIG, HDL,  CHOLHDL, VLDL, LDLCALC, LDLDIRECT   Other studies Reviewed: Additional studies/ records that were reviewed today with results demonstrating: imaging as noted above.   ASSESSMENT AND PLAN:  1. Coarctation of the aorta: Will see if further imaging is needed for chest and head MRI, given risk of berry aneurysms.  2+ DP pulses.   2. Hyperlipidemia: LDL 143 at last check.  Was 112 in 5/21.   3. Atypical chest pain: ECG unchanged.  Did not sound like angina.  Continue healthy life style.  4. Possible bicuspid valve- associated with coarctation of the aorta. No murmur on exam.  No sx of aortic stenosis.    Current medicines are reviewed at length with the patient today.  The patient concerns regarding his medicines were addressed.  The following changes have been made:  No change  Labs/ tests ordered today include:  No orders of the defined types were placed in this encounter.   Recommend 150 minutes/week of aerobic exercise Low fat, low carb, high fiber diet recommended  Disposition:   FU in 1 year   Signed, 6/21, MD  03/11/2020 3:50 PM    [facility] [address] [place], [place], [place]  [place] Phone: [phone]; Fax: [phone]

## 2020-03-18 ENCOUNTER — Telehealth: Payer: Self-pay

## 2020-03-18 DIAGNOSIS — Q251 Coarctation of aorta: Secondary | ICD-10-CM

## 2020-03-18 NOTE — Telephone Encounter (Signed)
Called patient and made him aware that we will order a chest and head MRA w/wo contrast. Patient aware that he will be contacted to schedule.

## 2020-03-18 NOTE — Telephone Encounter (Signed)
-----   Message from Corky Crafts, MD sent at 03/16/2020 12:08 PM EDT ----- MRA with and without contrast  JV ----- Message ----- From: Little Ishikawa, MD Sent: 03/15/2020   3:25 PM EDT To: Corky Crafts, MD  Yeah would do with and without Thayer Ohm ----- Message ----- From: Corky Crafts, MD Sent: 03/15/2020   2:26 PM EDT To: Little Ishikawa, MD  With and without contrast for these studies?  Thanks  JV ----- Message ----- From: Lattie Haw, RN Sent: 03/15/2020   8:12 AM EDT To: Corky Crafts, MD, Lattie Haw, RN  For the chest MRA and head MRA do you want with and without contrast?  Thanks, [place] ----- Message ----- From: Corky Crafts, MD Sent: 03/14/2020  11:19 AM EDT To: Lattie Haw, RN  OK to order chest MRA and head MRA as noted below.  JV ----- Message ----- From: Little Ishikawa, MD Sent: 03/13/2020   9:39 PM EDT To: Corky Crafts, MD  Cleone Slim Eden Lathe I would go ahead and get another chest MRA to evaluate his coarctation repair site- they generally recommend these every 5 years if asymptomatic, or sooner if symptoms develop.  If he has not had screening for berry aneurysm, would go ahead and get a head MRA as well Thayer Ohm ----- Message ----- From: Corky Crafts, MD Sent: 03/11/2020   5:00 PM EDT To: Little Ishikawa, MD  Salvadore Farber guy is 63 and had a coarct repair at age 3.  Last thoracic MRI was in 2016.  SHould he have another and should we do a brain MRI to look for berry aneurysm?  Not sure what the recommendations for MRI are and thought you may know.  THanks. JV

## 2020-04-08 ENCOUNTER — Ambulatory Visit (HOSPITAL_COMMUNITY)
Admission: RE | Admit: 2020-04-08 | Discharge: 2020-04-08 | Disposition: A | Discharge status: Home / Self Care | Payer: BC Managed Care – PPO | Source: Ambulatory Visit | Attending: Interventional Cardiology | Admitting: Interventional Cardiology

## 2020-04-08 DIAGNOSIS — Q251 Coarctation of aorta: Secondary | ICD-10-CM

## 2020-04-08 IMAGING — MR MR MRA HEAD W/O CM
1 series · 20 of 48 positions shown · non-contrast
Comparison: None.

CLINICAL DATA: Coarctation of the aorta. Screening for cerebral
aneurysm.

EXAM:
MRA HEAD WITHOUT CONTRAST
TECHNIQUE: Angiographic images of the Circle of Willis were obtained using MRA
technique without intravenous contrast.

[Series 5: 3d cow · axial · 0.5mm · 0.41mm/px · z∈[-141,-53]mm · 20 of 188 slices shown]
[im 1/188]
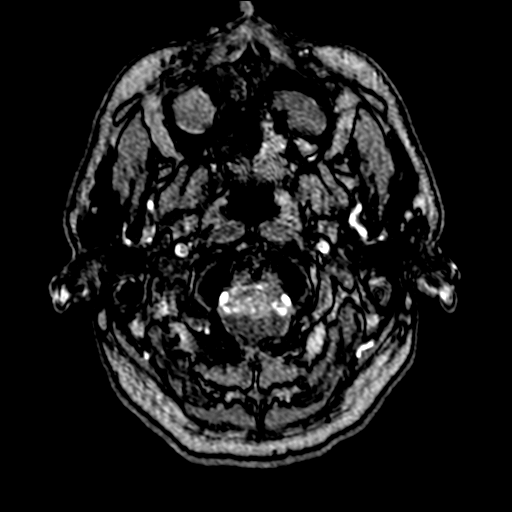
[im 4/188]
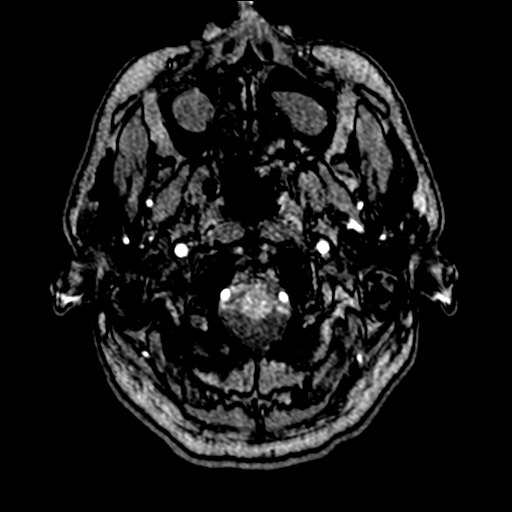
[im 8/188]
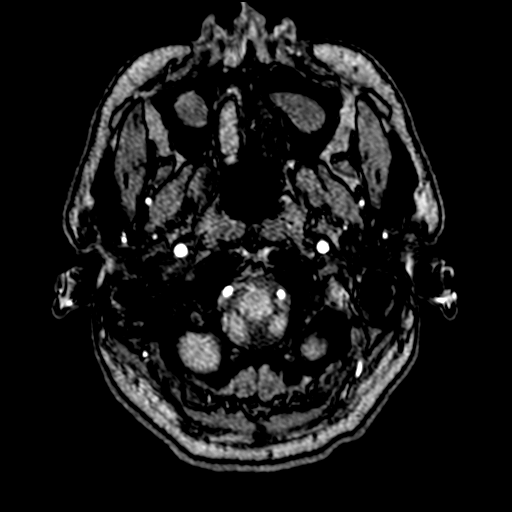
[im 12/188]
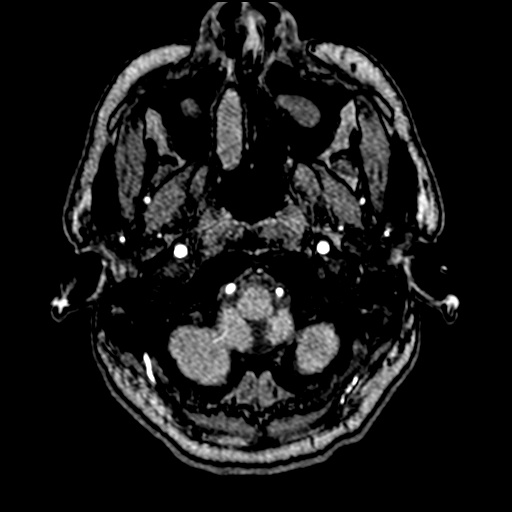
[im 16/188]
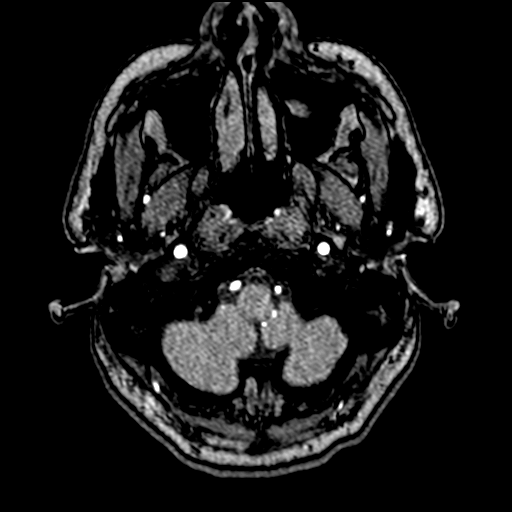
[im 20/188]
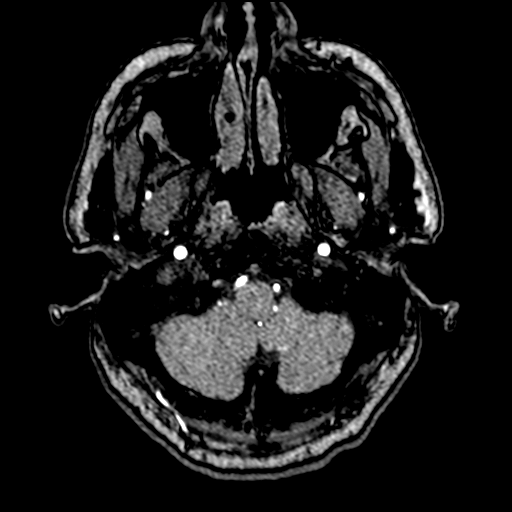
[im 24/188]
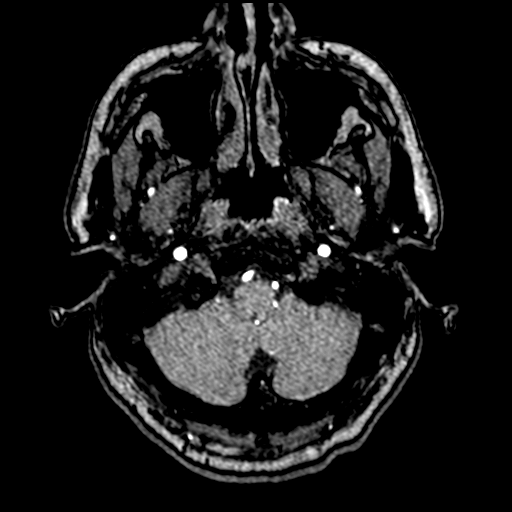
[im 28/188]
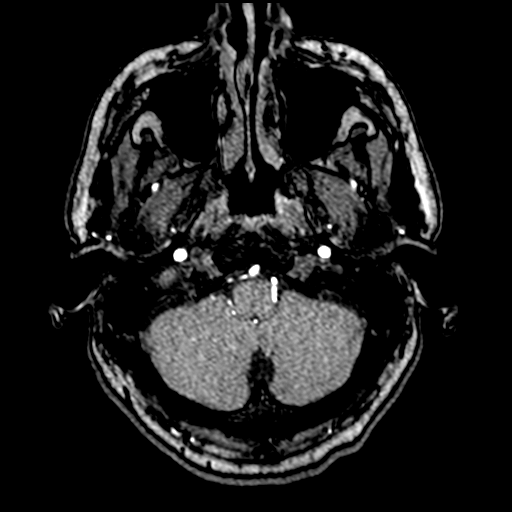
[im 32/188]
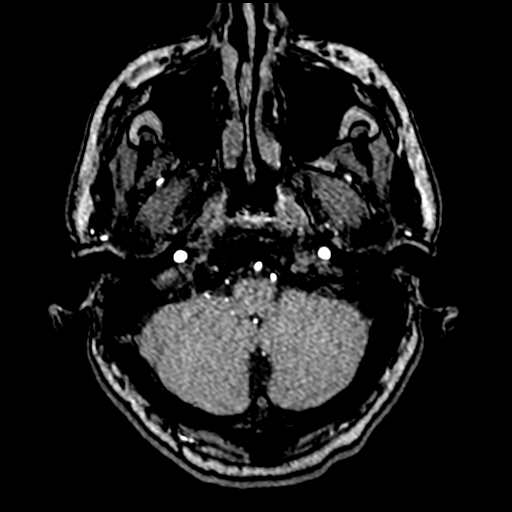
[im 36/188]
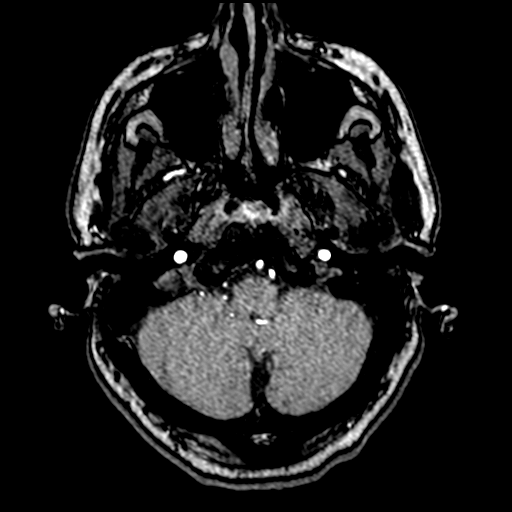
[im 40/188]
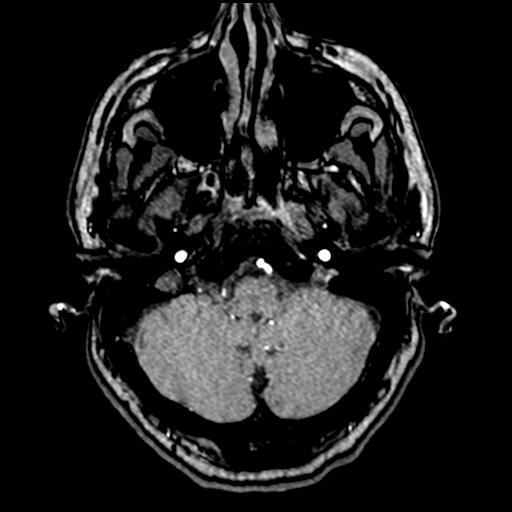
[im 44/188]
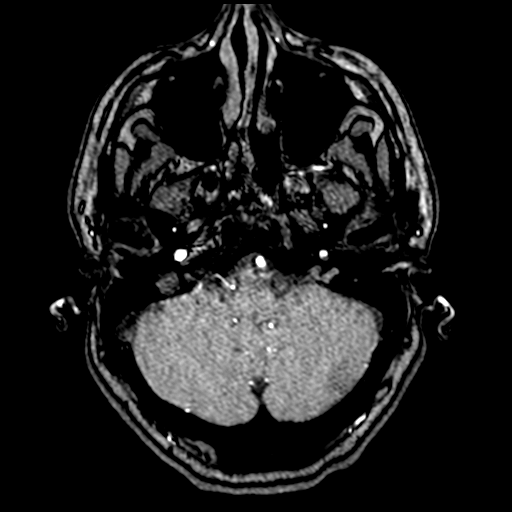
[im 60/188]
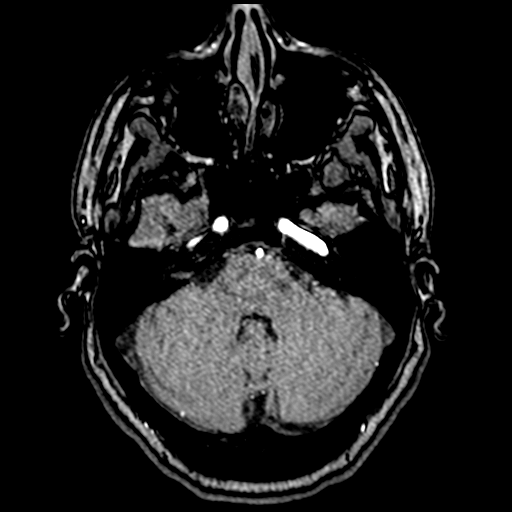
[im 84/188]
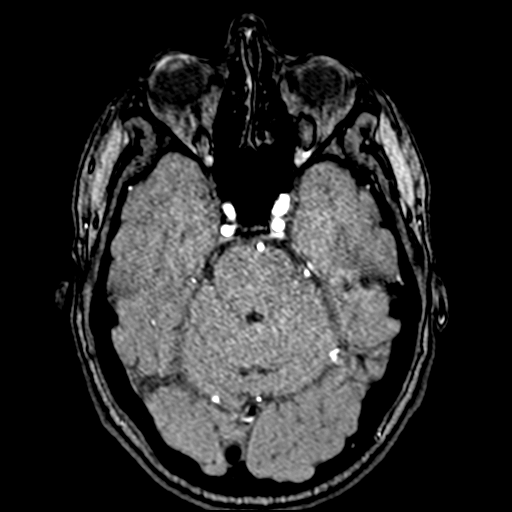
[im 96/188]
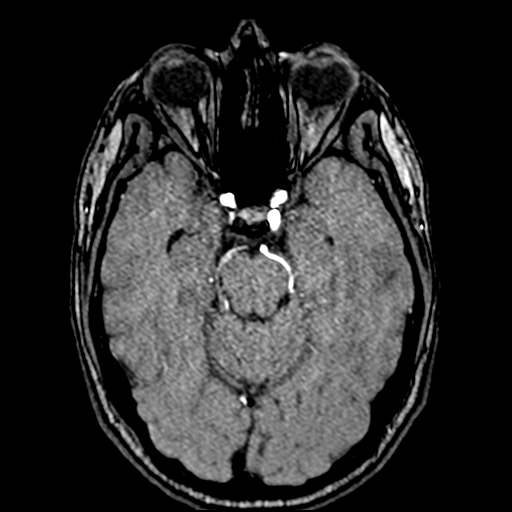
[im 108/188]
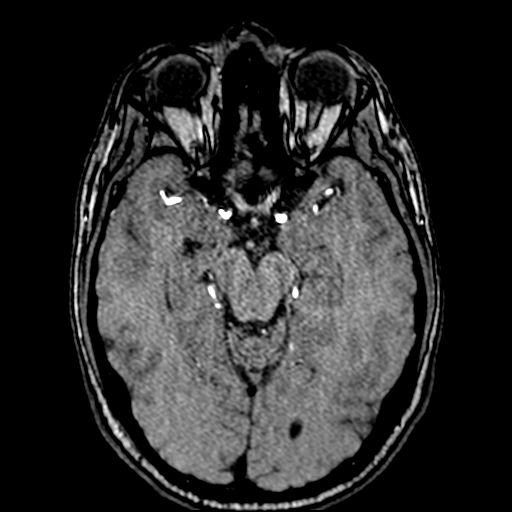
[im 132/188]
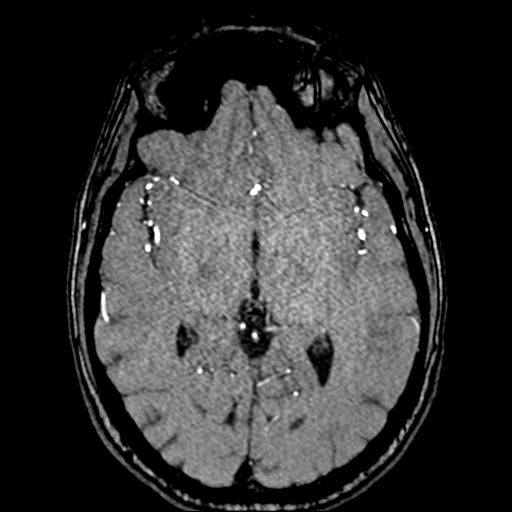
[im 156/188]
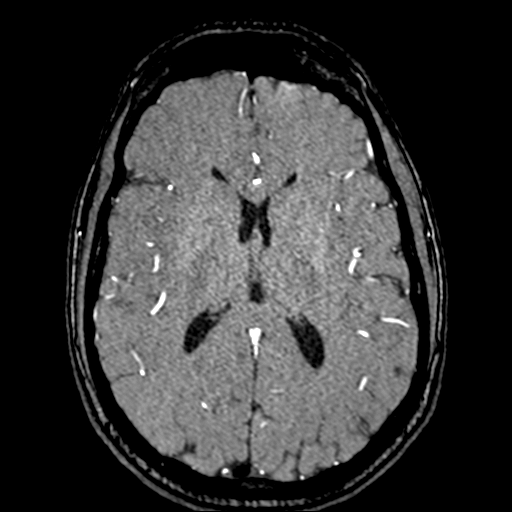
[im 160/188]
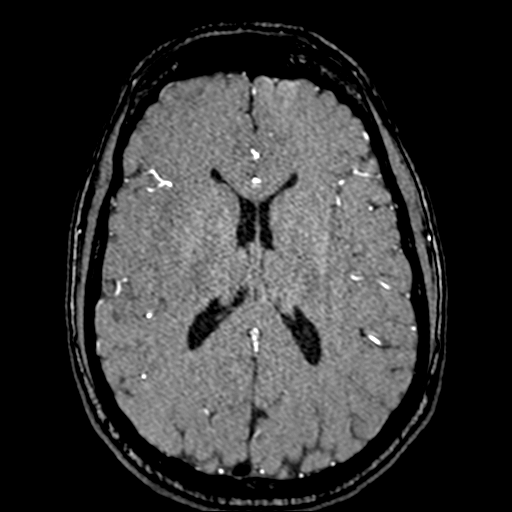
[im 180/188]
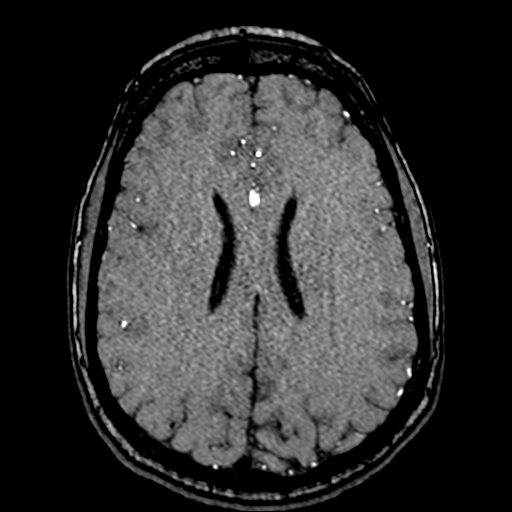

[20 of 48 positions shown; findings below may reference images not displayed]

FINDINGS: No evidence of significant (greater than 50%) arterial stenosis or
occlusion. Right fetal type PCA with hypoplastic right P1 PCA,
anatomic variant. Small (less than 3 mm) conical outpouching at the
right superior cerebellar artery origin is consistent with an
infundibulum. Apparent small (2 mm) inferiorly directed outpouching
arising from the left anterior communicating artery origin (series
5, image 114), suggestive of an aneurysm. No other aneurysm
identified.
IMPRESSION: 1. Possible small (2 mm) inferiorly directed left anterior
communicating artery aneurysm. Recommend follow-up CTA or MRA in 1
year to ensure stability.
2. No significant arterial stenosis or occlusion.

## 2020-04-08 IMAGING — MR MR MRA CHEST W/ OR W/O CM
15 series · 16 of 16 positions shown · IV contrast (gadavist)
Comparison: None.

CLINICAL DATA: Remote history of congenital heart disease, status
post coarctation repair as a child.

EXAM:
MRA CHEST WITH OR WITHOUT CONTRAST
TECHNIQUE: Angiographic images of the chest were obtained using MRA technique
without and with intravenous contrast.
CONTRAST:  7mL GADAVIST GADOBUTROL 1 MMOL/ML IV SOLN

[Series 2: [id number]_bh · axial · 8.0mm · 0.62mm/px · 1 of 25 slices shown]
[im 1/25]
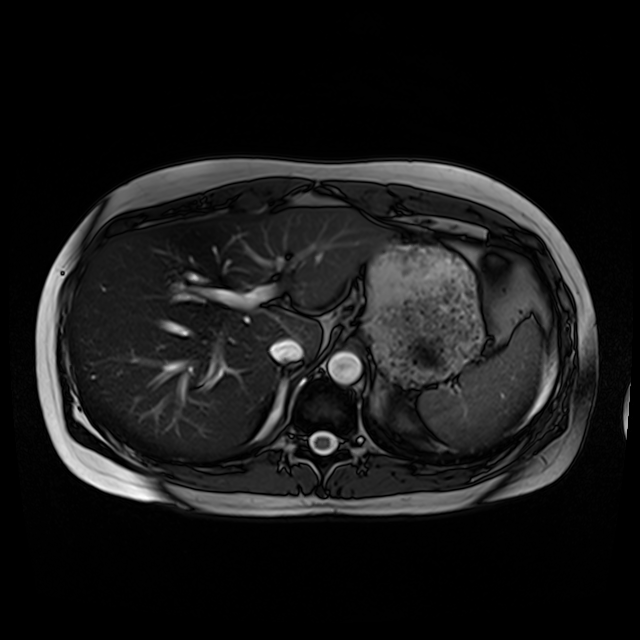

[Series 5: (person_name)_(person_name)_(person_name) · sagittal · 8.0mm · 1.79mm/px · 1 of 25 slices shown (1 of 8)]
[im 1/25]
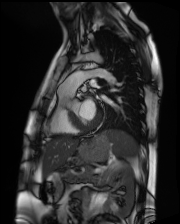

[Series 5: (person_name)_(person_name)_(person_name) · sagittal · 8.0mm · 1.79mm/px · 1 of 25 slices shown (2 of 8)]
[im 1/25]
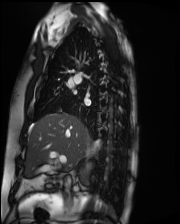

[Series 5: (person_name)_(person_name)_(person_name) · sagittal · 8.0mm · 1.79mm/px · 1 of 25 slices shown (3 of 8)]
[im 1/25]
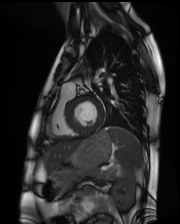

[Series 5: (person_name)_(person_name)_(person_name) · sagittal · 8.0mm · 1.79mm/px · 1 of 25 slices shown (4 of 8)]
[im 1/25]
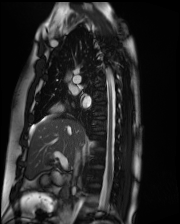

[Series 5: (person_name)_(person_name)_(person_name) · sagittal · 8.0mm · 1.79mm/px · 1 of 25 slices shown (5 of 8)]
[im 1/25]
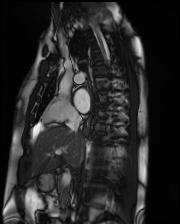

[Series 5: (person_name)_(person_name)_(person_name) · sagittal · 8.0mm · 1.79mm/px · 1 of 25 slices shown (6 of 8)]
[im 1/25]
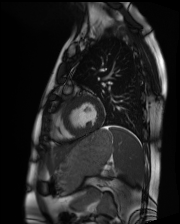

[Series 5: (person_name)_(person_name)_(person_name) · sagittal · 8.0mm · 1.79mm/px · 1 of 25 slices shown (7 of 8)]
[im 1/25]
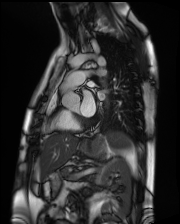

[Series 5: (person_name)_(person_name)_(person_name) · sagittal · 8.0mm · 1.79mm/px · 1 of 25 slices shown (8 of 8)]
[im 1/25]
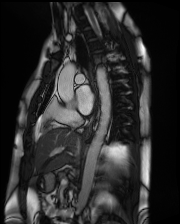

[Series 6: T1 dynamic · axial · non-contrast · 3.3mm · 1.18mm/px · 1 of 88 slices shown]
[im 1/88]
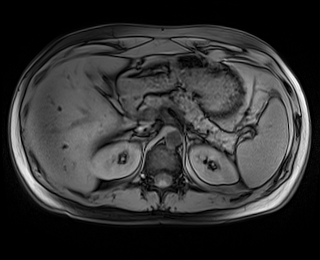

[Series 11: candy cane ce-arterial · sagittal · arterial · 1.1mm · 1.17mm/px · 1 of 112 slices shown]
[im 1/112]
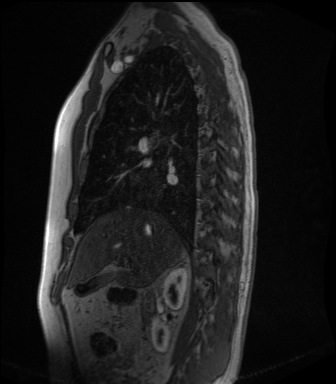

[Series 12: candy cane ce-arterial_sub · sagittal · 1.1mm · 1.17mm/px · 1 of 109 slices shown]
[im 1/109]
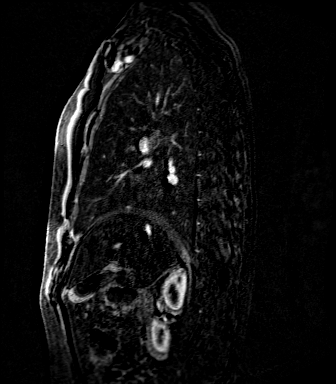

[Series 14: candy cane ce-venous · sagittal · portal-venous · 1.1mm · 1.17mm/px · 1 of 112 slices shown]
[im 1/112]
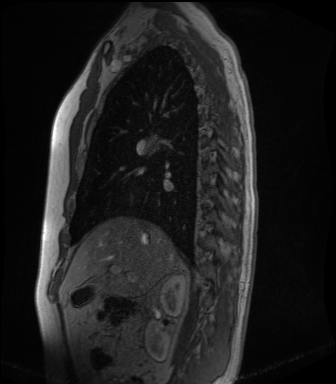

[Series 15: candy cane ce-venous_sub · sagittal · 1.1mm · 1.17mm/px · 1 of 112 slices shown]
[im 1/112]
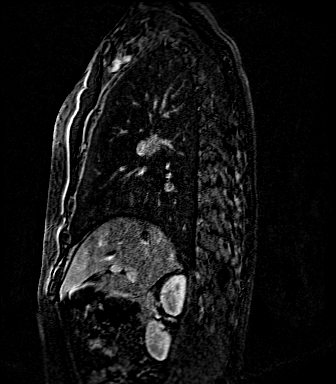

[Series 17: T1 dynamic post-contrast · axial · 3.3mm · 1.18mm/px · z∈[-206,+82]mm · 2 of 88 slices shown]
[im 1/88]
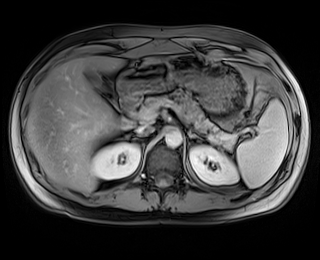
[im 88/88]
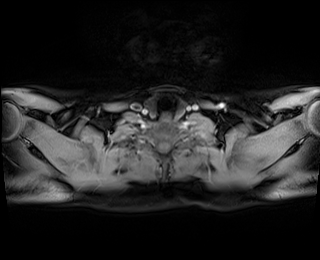

[16 of 16 positions shown; findings below may reference images not displayed]

FINDINGS: VASCULAR

Aorta: Intact aorta without acute dissection or aneurysm. No focal
wall thickening appreciated. Patent 3 vessel arch anatomy.

Aorta:

Sinus of Valsalva measures 3.4 cm

Sinotubular junction measures 2.6 cm.

Ascending thoracic aortic diameter measures 2.8 cm.

Distal transverse aorta measures 1.6 cm, mildly narrowed.

Proximal descending thoracic aorta (coarctation repair) measures
cm in diameter, mildly narrowed.

Mid descending thoracic aorta measures 2.3 cm.

Patent 3 vessel arch anatomy.

Included abdominal aorta demonstrates normal caliber. Celiac, SMA,
and renal arteries are widely patent.

Heart: Normal heart size.  No pericardial effusion.

Pulmonary Arteries: Central pulmonary arteries appear patent and
normal in caliber.

Other: None.

NON-VASCULAR

No other acute intrathoracic finding.  No pleural effusion.
IMPRESSION: VASCULAR

Status post thoracic aorta coarctation repair with aortic luminal
narrowing at the coarctation repair site measuring 1.4 cm in
diameter. Aortic measurements as above.

No other acute vascular finding.

NON-VASCULAR

No other acute intrathoracic finding.

## 2020-04-08 MED ORDER — GADOBUTROL 1 MMOL/ML IV SOLN
7.0000 mL | Freq: Once | INTRAVENOUS | Status: AC | PRN
  Administered 2020-04-08: 7 mL via INTRAVENOUS

## 2020-04-11 ENCOUNTER — Other Ambulatory Visit: Payer: Self-pay

## 2020-04-11 ENCOUNTER — Telehealth: Payer: Self-pay | Admitting: Interventional Cardiology

## 2020-04-11 DIAGNOSIS — Q251 Coarctation of aorta: Secondary | ICD-10-CM

## 2020-04-11 NOTE — Telephone Encounter (Signed)
The patient has been notified of the results for MRA of the chest and head and verbalized understanding.  All questions (if any) were answered. Lattie Haw, RN 04/11/2020 3:14 PM

## 2020-04-11 NOTE — Telephone Encounter (Signed)
    Pt said he saw his MRA result on mychart but she needs to speak with Dr. Eldridge Dace or his nurse to discuss for further details.

## 2020-04-13 ENCOUNTER — Other Ambulatory Visit: Payer: Self-pay

## 2020-04-13 DIAGNOSIS — Q251 Coarctation of aorta: Secondary | ICD-10-CM

## 2020-04-15 ENCOUNTER — Ambulatory Visit (HOSPITAL_COMMUNITY): Payer: BC Managed Care – PPO | Attending: Cardiology

## 2020-04-15 ENCOUNTER — Other Ambulatory Visit: Payer: Self-pay

## 2020-04-15 DIAGNOSIS — Q251 Coarctation of aorta: Secondary | ICD-10-CM | POA: Diagnosis present

## 2020-04-15 LAB — ECHOCARDIOGRAM COMPLETE
AR max vel: 3.29 cm2
AV Area VTI: 3.23 cm2
AV Area mean vel: 3.39 cm2
AV Mean grad: 3 mmHg
AV Peak grad: 5 mmHg
Ao pk vel: 1.12 m/s
Area-P 1/2: 5.97 cm2
S' Lateral: 3.3 cm

## 2020-04-15 MED ORDER — PERFLUTREN LIPID MICROSPHERE
1.0000 mL | INTRAVENOUS | Status: AC | PRN
Start: 2020-04-15 — End: 2020-04-15
  Administered 2020-04-15: 1 mL via INTRAVENOUS

## 2021-01-10 ENCOUNTER — Other Ambulatory Visit: Payer: Self-pay | Admitting: Gastroenterology

## 2021-01-10 DIAGNOSIS — R1032 Left lower quadrant pain: Secondary | ICD-10-CM

## 2021-01-10 DIAGNOSIS — K625 Hemorrhage of anus and rectum: Secondary | ICD-10-CM

## 2021-01-13 ENCOUNTER — Ambulatory Visit (HOSPITAL_BASED_OUTPATIENT_CLINIC_OR_DEPARTMENT_OTHER)
Admission: RE | Admit: 2021-01-13 | Discharge: 2021-01-13 | Disposition: A | Discharge status: Home / Self Care | Payer: BC Managed Care – PPO | Source: Ambulatory Visit | Attending: Gastroenterology | Admitting: Gastroenterology

## 2021-01-13 ENCOUNTER — Other Ambulatory Visit: Payer: Self-pay

## 2021-01-13 ENCOUNTER — Encounter (HOSPITAL_BASED_OUTPATIENT_CLINIC_OR_DEPARTMENT_OTHER): Payer: Self-pay

## 2021-01-13 DIAGNOSIS — K625 Hemorrhage of anus and rectum: Secondary | ICD-10-CM | POA: Insufficient documentation

## 2021-01-13 DIAGNOSIS — R1032 Left lower quadrant pain: Secondary | ICD-10-CM | POA: Diagnosis present

## 2021-01-13 IMAGING — CT CT ABD-PELV W/ CM
2 of 4 series · 16 of 46 positions shown, 18 images · IV contrast (APPLIED)
Comparison: None.

CLINICAL DATA: 35-year-old male with chronic abdominal and pelvic
pain.

EXAM:
CT ABDOMEN AND PELVIS WITH CONTRAST
TECHNIQUE: Multidetector CT imaging of the abdomen and pelvis was performed
using the standard protocol following bolus administration of
intravenous contrast.
CONTRAST:  100mL OMNIPAQUE IOHEXOL 300 MG/ML  SOLN

[Series 2: abd pel w · axial · 0.70mm/px · z∈[+776,+1246]mm · 13 of 104 slices shown, 15 images]
[im 5/104  soft-tissue]
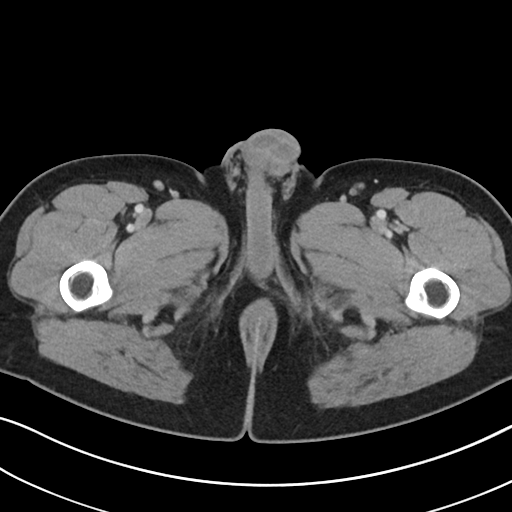
[im 5/104  bone]
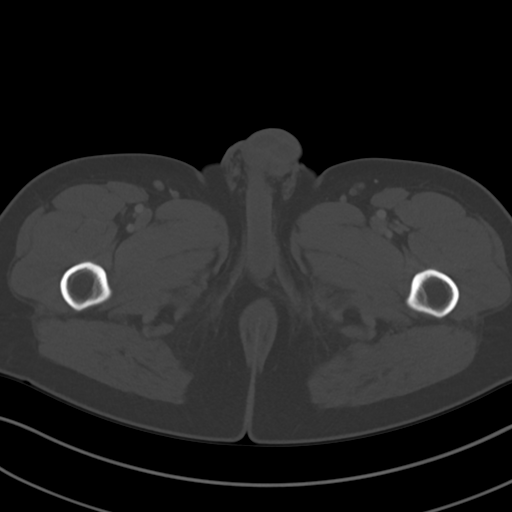
[im 13/104  soft-tissue]
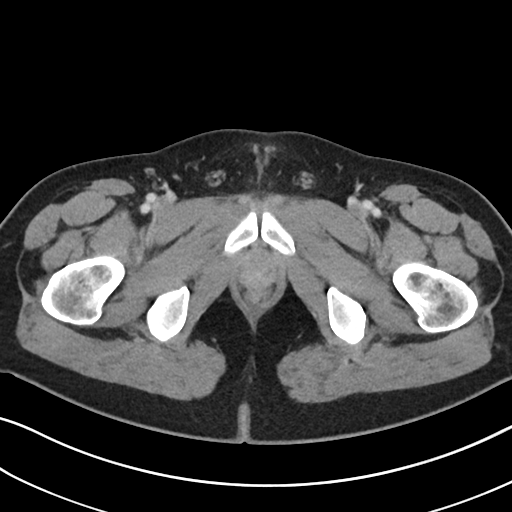
[im 21/104  soft-tissue]
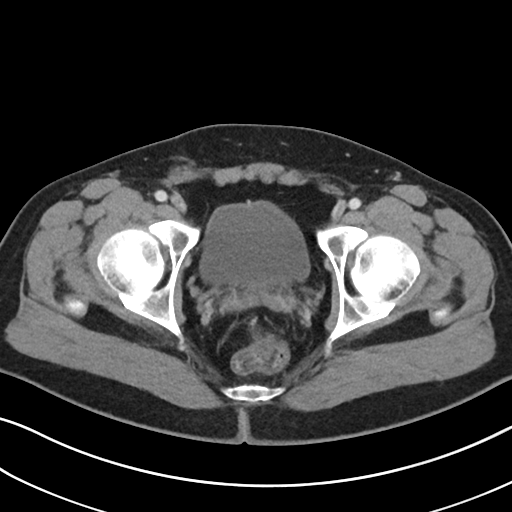
[im 29/104  soft-tissue]
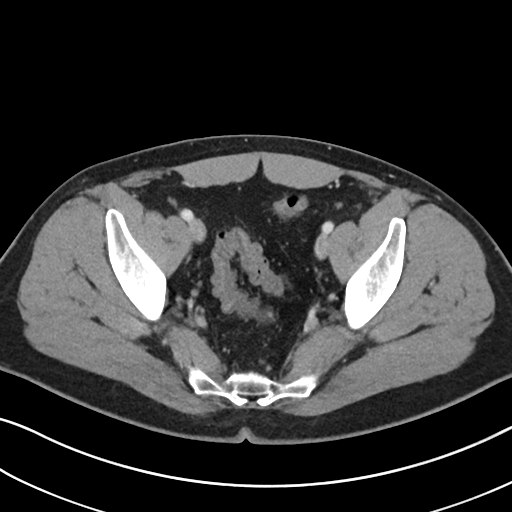
[im 38/104  soft-tissue]
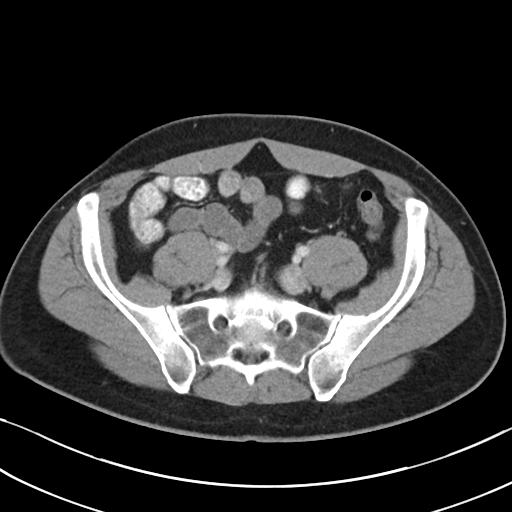
[im 46/104  soft-tissue]
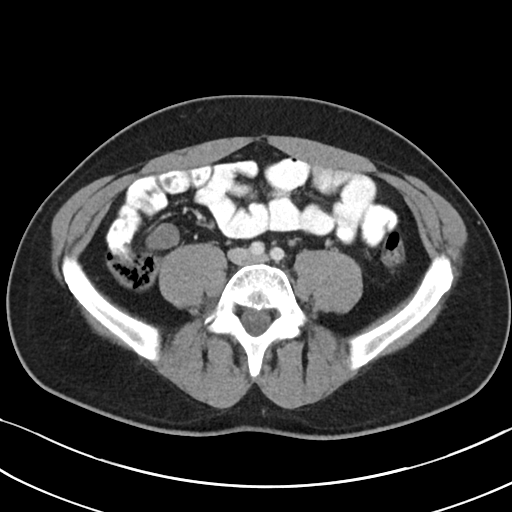
[im 54/104  soft-tissue]
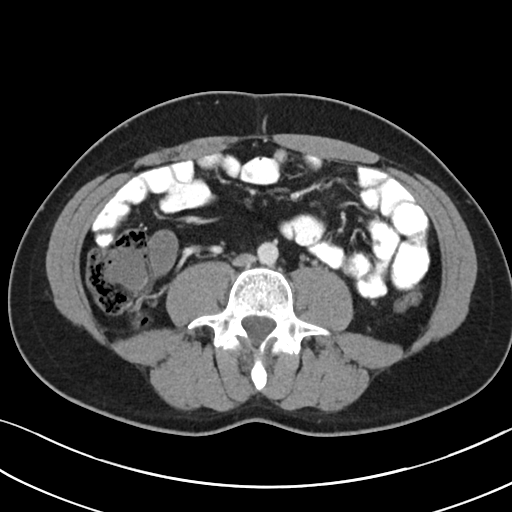
[im 58/104  soft-tissue]
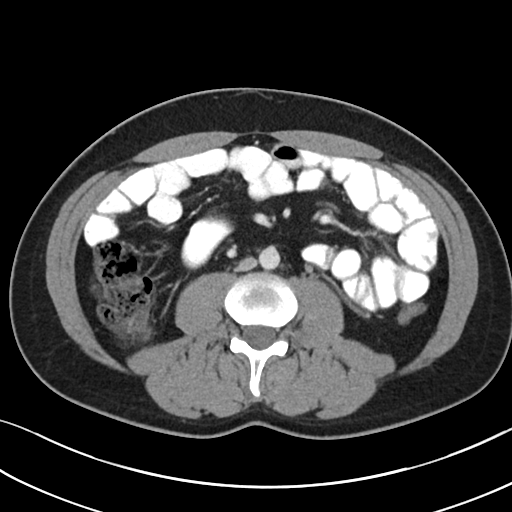
[im 66/104  soft-tissue]
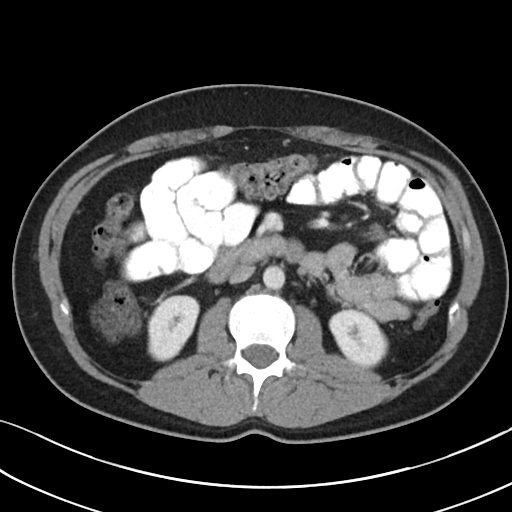
[im 66/104  bone]
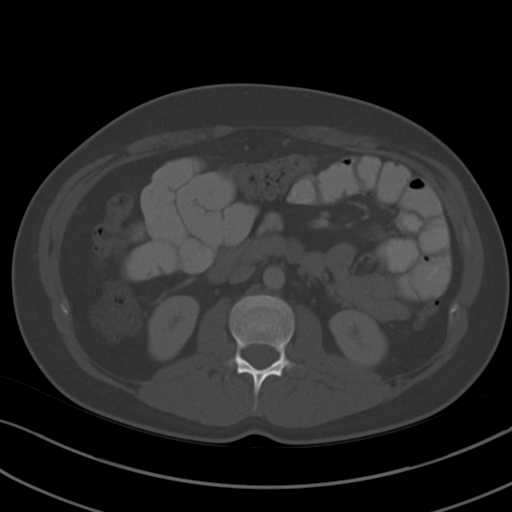
[im 75/104  soft-tissue]
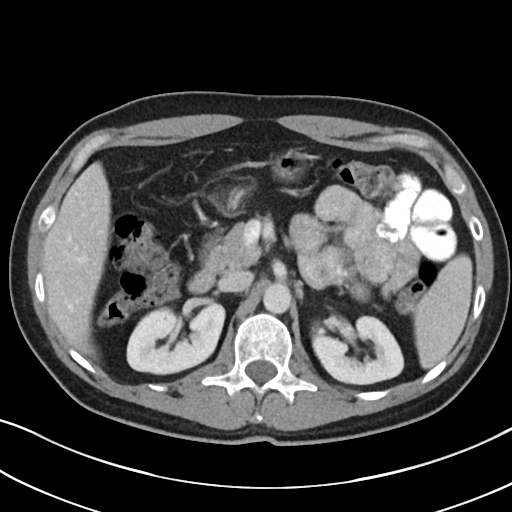
[im 83/104  soft-tissue]
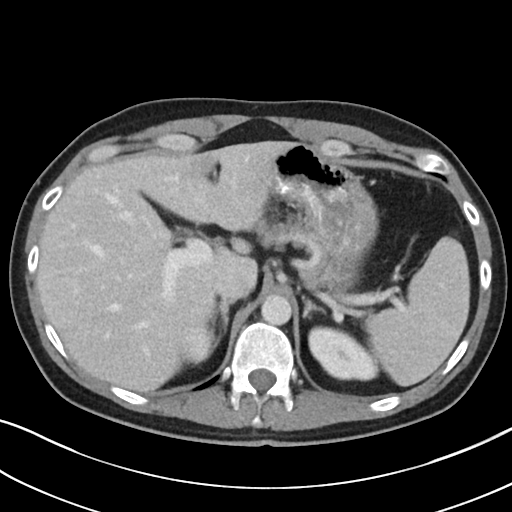
[im 91/104  soft-tissue]
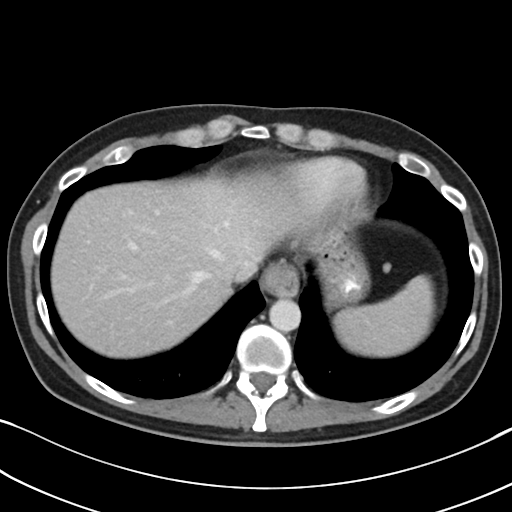
[im 99/104  soft-tissue]
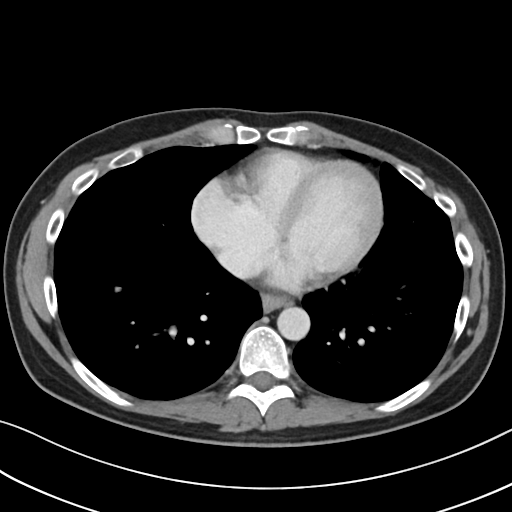

[Series 4: coronal · coronal · 0.82mm/px · 3 of 76 slices shown]
[im 26/76  soft-tissue]
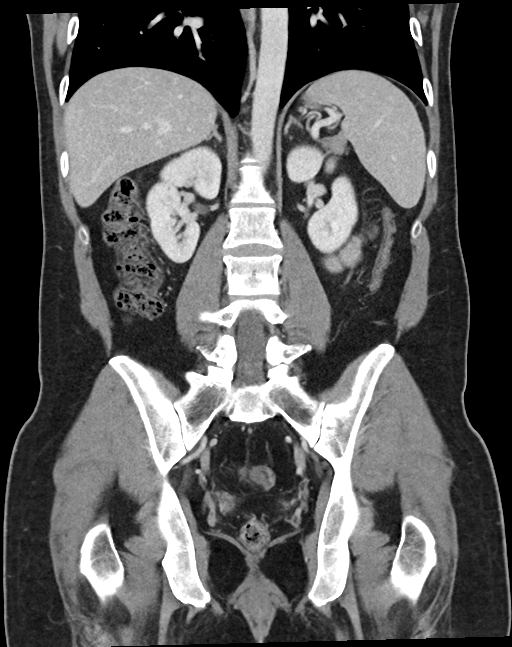
[im 34/76  soft-tissue]
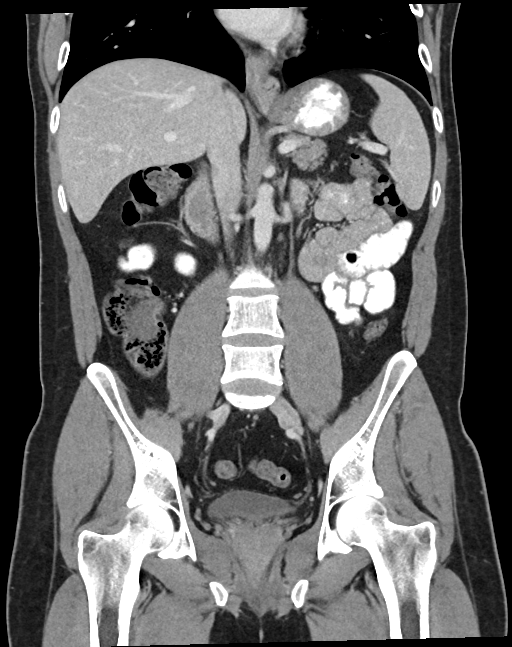
[im 42/76  soft-tissue]
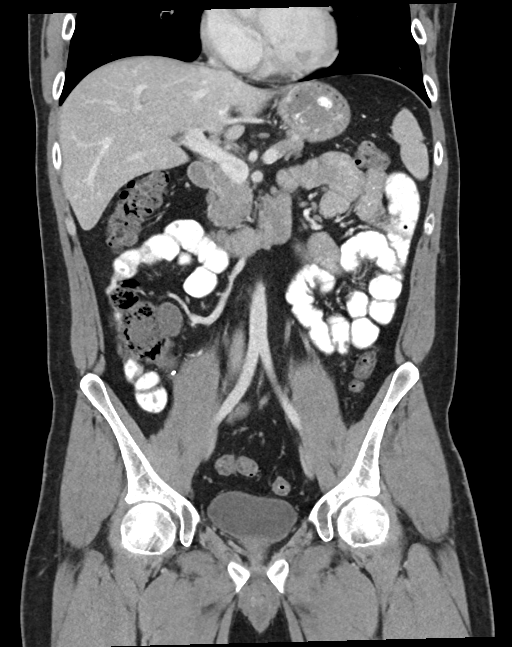

[16 of 46 positions shown; findings below may reference images not displayed]

FINDINGS: Lower chest: No acute abnormality.

Hepatobiliary: No significant liver or gallbladder abnormalities
noted. No biliary dilatation.

Pancreas: Unremarkable

Spleen: Unremarkable

Adrenals/Urinary Tract: The kidneys, adrenal glands and bladder are
unremarkable.

Stomach/Bowel: A small hiatal hernia is noted. No evidence of bowel
wall thickening, distention, or inflammatory changes.

Vascular/Lymphatic: No significant vascular findings are present. No
enlarged abdominal or pelvic lymph nodes.

Reproductive: Prostate is unremarkable.

Other: No ascites, focal collection or pneumoperitoneum.

Musculoskeletal: No acute or suspicious bony abnormalities.
IMPRESSION: 1. Small hiatal hernia.
2. No other significant abnormalities.

## 2021-01-13 MED ORDER — IOHEXOL 300 MG/ML  SOLN
100.0000 mL | Freq: Once | INTRAMUSCULAR | Status: AC | PRN
  Administered 2021-01-13: 100 mL via INTRAVENOUS

## 2021-04-06 ENCOUNTER — Other Ambulatory Visit: Payer: Self-pay

## 2021-04-06 ENCOUNTER — Ambulatory Visit (HOSPITAL_COMMUNITY)
Admission: RE | Admit: 2021-04-06 | Discharge: 2021-04-06 | Disposition: A | Discharge status: Home / Self Care | Payer: BC Managed Care – PPO | Source: Ambulatory Visit | Attending: Interventional Cardiology | Admitting: Interventional Cardiology

## 2021-04-06 DIAGNOSIS — Q251 Coarctation of aorta: Secondary | ICD-10-CM | POA: Insufficient documentation

## 2021-04-06 IMAGING — MR MR MRA HEAD W/O CM
1 series · 19 of 48 positions shown · non-contrast
Comparison: Previous MRI from [DATE].

CLINICAL DATA: Initial evaluation for cerebral aneurysm screening.
History of coarctation of the aorta.

EXAM:
MRA HEAD WITHOUT CONTRAST
TECHNIQUE: Angiographic images of the Circle of Willis were acquired using MRA
technique without intravenous contrast.

[Series 3: (id) mt fs · axial · 1.4mm · 0.43mm/px · z∈[-64,+26]mm · 19 of 136 slices shown]
[im 1/136]
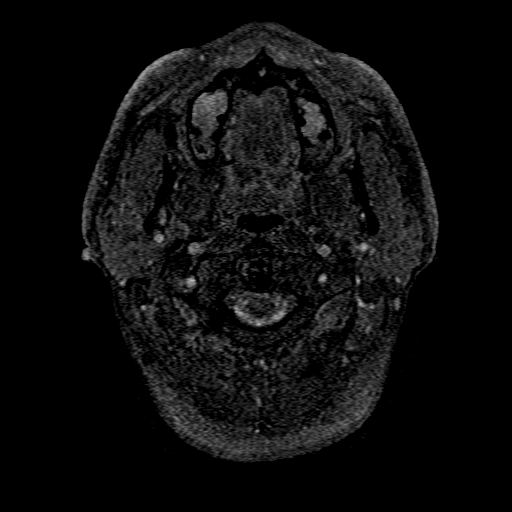
[im 3/136]
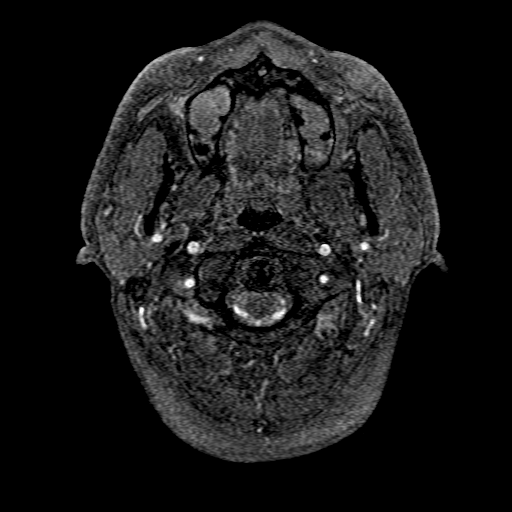
[im 6/136]
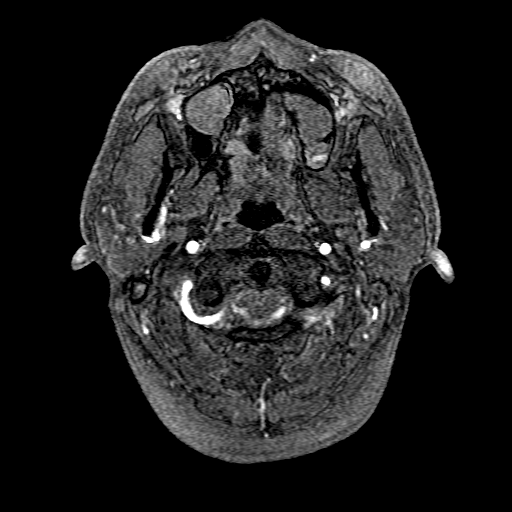
[im 9/136]
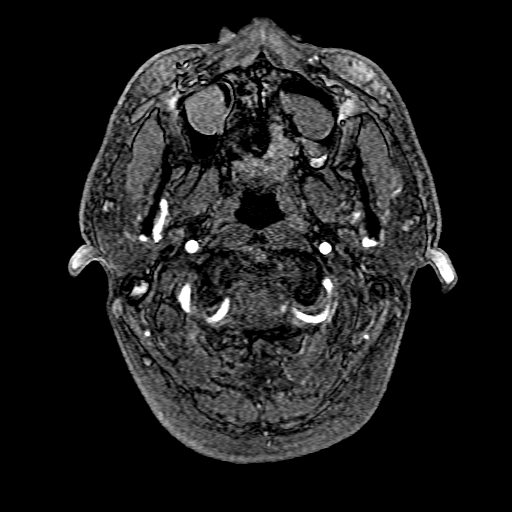
[im 12/136]
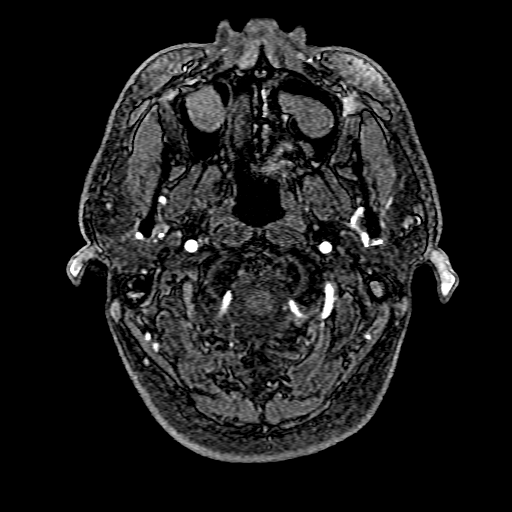
[im 15/136]
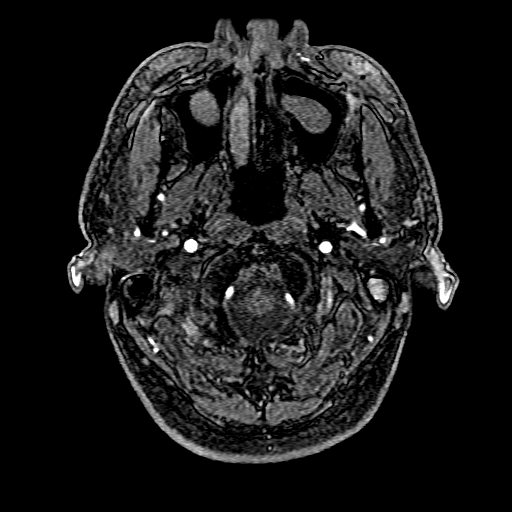
[im 18/136]
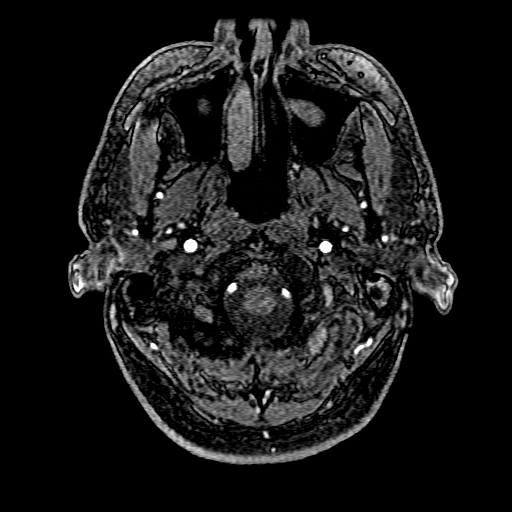
[im 21/136]
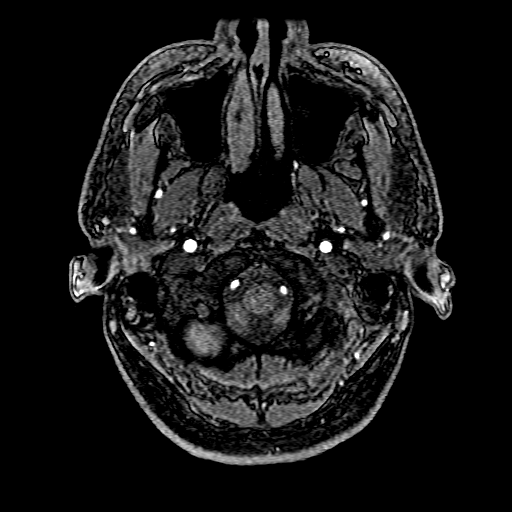
[im 23/136]
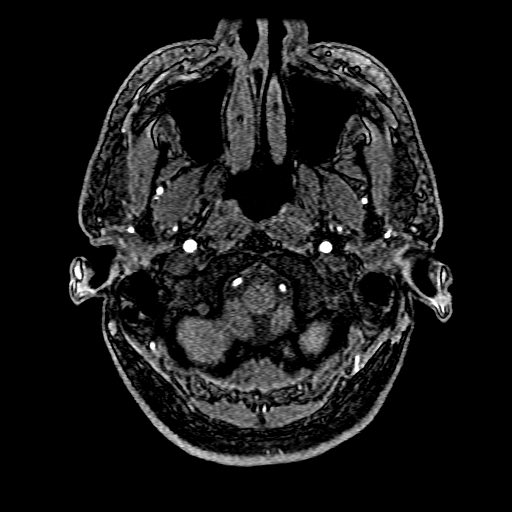
[im 26/136]
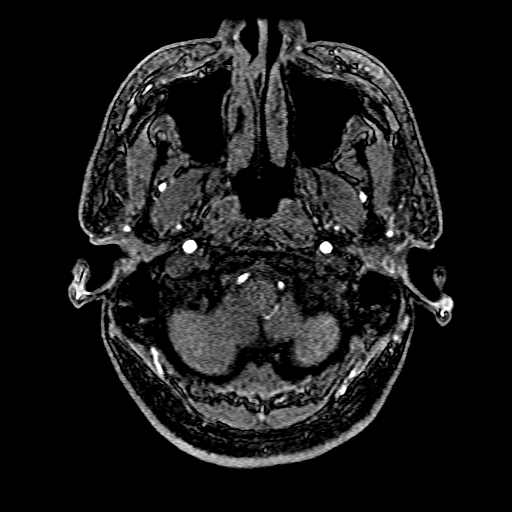
[im 29/136]
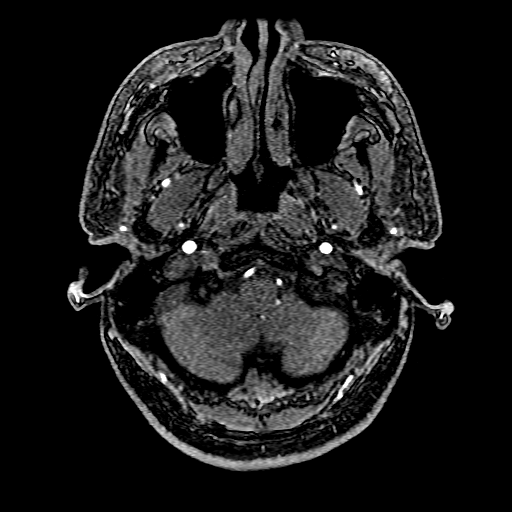
[im 44/136]
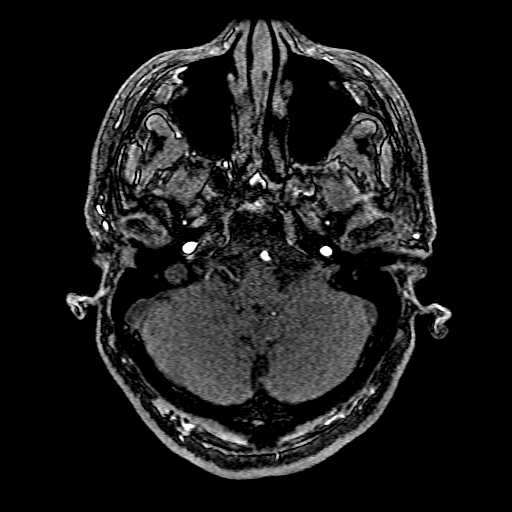
[im 61/136]
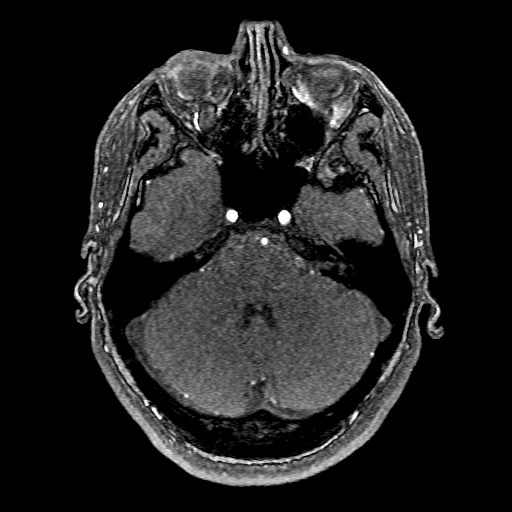
[im 69/136]
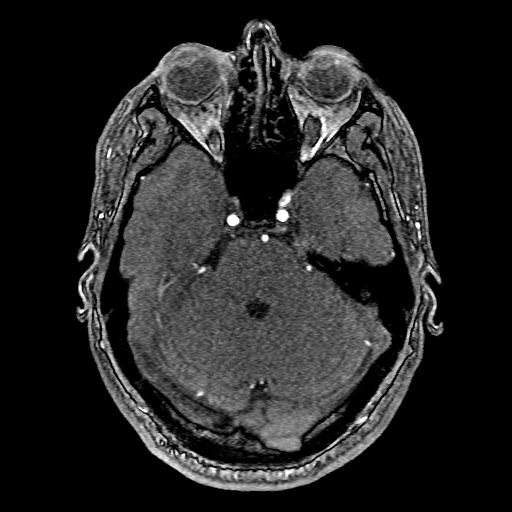
[im 78/136]
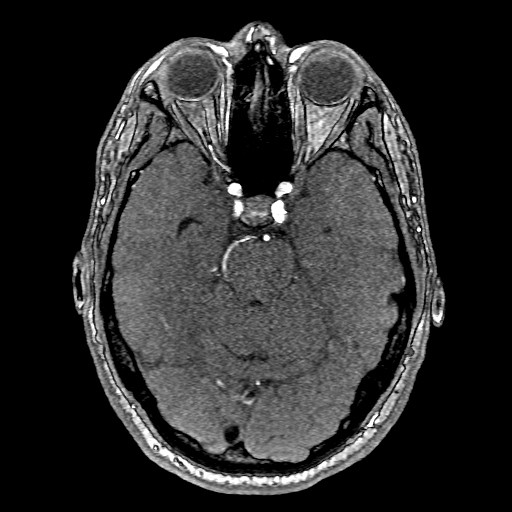
[im 95/136]
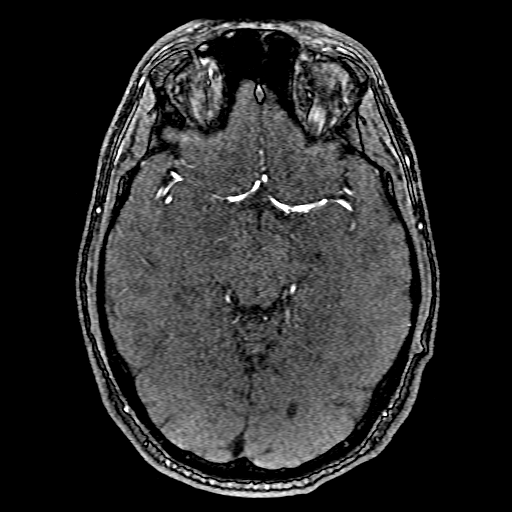
[im 113/136]
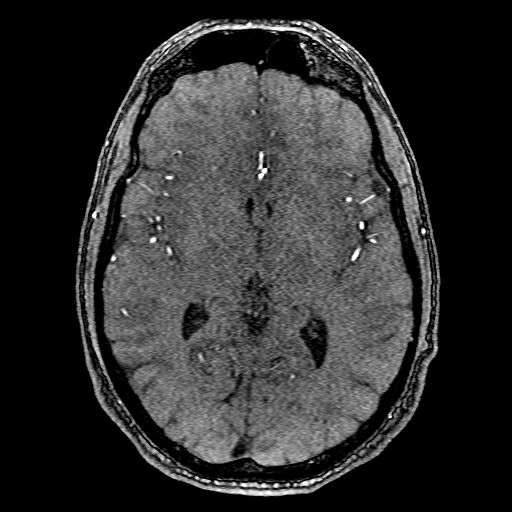
[im 115/136]
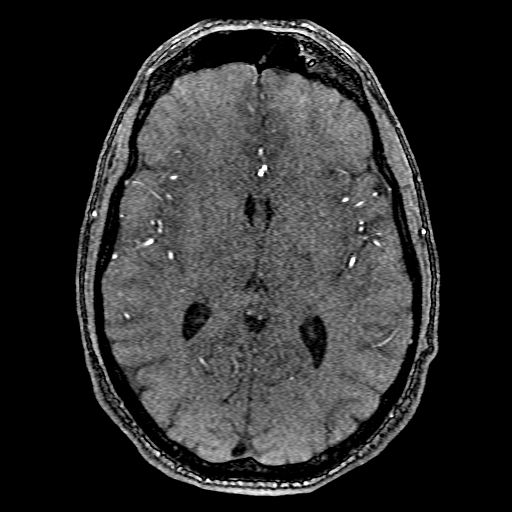
[im 130/136]
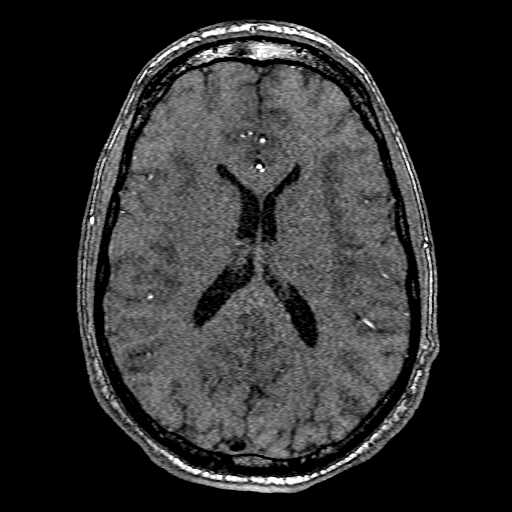

[19 of 48 positions shown; findings below may reference images not displayed]

FINDINGS: Anterior circulation: Visualized distal cervical segments of the
internal carotid arteries are widely patent with antegrade flow.
Petrous, cavernous, and supraclinoid segments widely patent without
stenosis. Tiny 1-2 mm outpouching extending laterally from the
cavernous left ICA demonstrates a small vessel emanating from its
apex, consistent with a infundibulum. Origins of the ophthalmic
arteries normal. A1 segments patent bilaterally. Again seen is a 2
mm outpouching involving the anterior communicating artery complex,
possibly reflecting a small aneurysm (series 304, image 7). This is
stable and not significantly changed from prior. Both anterior
cerebral arteries widely patent distally without abnormality. M1
segments widely patent. Normal MCA bifurcations. Distal MCA branches
well perfused and symmetric.

Posterior circulation: Both vertebral arteries widely patent to the
vertebrobasilar junction without stenosis. Right vertebral artery
slightly dominant. Both PICA origins patent and normal. Basilar
widely patent to its distal aspect without stenosis. Superior
cerebral arteries patent bilaterally. Vascular infundibulum at the
origin of the right superior cerebellar arteries again noted. Left
PCA primarily supplied via the basilar. Predominant fetal type
origin of the right PCA supplied via a robust right posterior
communicating artery. Both PCAs widely patent to their distal
aspects.

Anatomic variants: Predominant fetal type origin of the right PCA.

Other: None.
IMPRESSION: 1. Possible small 2 mm outpouching involving the anterior
communicating artery complex, potentially reflecting a small
aneurysm. Finding is stable as compared to [DATE]. Given patient
history, continued annual surveillance is recommended.
2. Otherwise stable and negative intracranial MRA.

## 2021-04-07 ENCOUNTER — Telehealth: Payer: Self-pay | Admitting: *Deleted

## 2021-04-07 DIAGNOSIS — I671 Cerebral aneurysm, nonruptured: Secondary | ICD-10-CM

## 2021-04-07 NOTE — Telephone Encounter (Signed)
Patient notified

## 2021-04-07 NOTE — Telephone Encounter (Signed)
-----   Message from Corky Crafts, MD sent at 04/07/2021  8:59 AM EDT ----- Small possible aneurysm noted in the Anterior communicating artery.  Repeat scan in 1 year

## 2021-08-13 NOTE — Progress Notes (Signed)
Cardiology Office Note   Date:  08/14/2021   ID:  OUSMANE Dean, DOB 1985/10/13, MRN [id number]  PCP:  Farris Has, MD    Chief Complaint  Patient presents with   Follow-up   H/o coarctation of the aorta  Wt Readings from Last 3 Encounters:  08/14/21 179 lb (81.2 kg)  03/11/20 174 lb 3.2 oz (79 kg)  03/24/17 176 lb (79.8 kg)       History of Present Illness: Edgar Dean is a 36 y.o. male  has a history of congenital Coarctation of the aorta repaired in 1989. 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   Records show: "He has had intermittent follow-up after coarctation repair when he was 39-1/37 years old. He has no complaints. He is physically active. He has relocated back to [place] where he was born and raised. He was diagnosed and treated by Dr. Fletcher Anon. After graduating [employer], he has lived in [place] with his wife. He is establishing with cardiology to have routine follow-up. Last imaging was done in the [place] area at [facility] [address] [address], [place], [place] [address]. He has a disc with a 2016 aortic MRI from that institution.                                                                          Aortic MRA performed in [place] March 2016 demonstrated aortic root of 3.1 cm, abnormal morphology of the large distal to the left subclavian ostium consistent with prior aortic coarctation repair.   Stress echo done April 2016 demonstrated 2 mm of inferolateral ST segment depression, upsloping, with normal LVEF during exercise and post exercise."   He has not been seen since 2018.   A few few months ago, he had some persistent chest in his chest and it resolved after a few days.  No recurrence.  Not related to activity.     He got his vaccines against COVID.  He does geriatric care management.  MRA of the brain: "Possible small 2 mm  outpouching involving the anterior communicating artery complex, potentially reflecting a small aneurysm. Finding is stable as compared to 04/08/2020. Given patient history, continued annual surveillance is recommended. 2. Otherwise stable and negative intracranial MRA."  Denies : Chest pain. Dizziness. Leg edema. Nitroglycerin use. Orthopnea. Palpitations. Paroxysmal nocturnal dyspnea. Shortness of breath. Syncope.    He has increased exercise- home rowing machine 4x/week. Occasional headaches with increased stress.  Relieved with Tylenol.     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   No current facility-administered medications for this visit.    Allergies:   Patient has no known allergies.    Social History:  The patient  reports that he has never  smoked. He has never used smokeless tobacco. He reports current alcohol use. He reports that he does not use drugs.   Family History:  The patient's family history includes Healthy in his brother and father; Other in his mother.    ROS:  Please see the history of present illness.   Otherwise, review of systems are positive for rare headaches.   All other systems are reviewed and negative.    PHYSICAL EXAM: VS:  BP 112/70    Pulse (!) 58    Ht 5\' 11"  (1.803 m)    Wt 179 lb (81.2 kg)    SpO2 97%    BMI 24.97 kg/m  , BMI Body mass index is 24.97 kg/m. GEN: Well nourished, well developed, in no acute distress HEENT: normal Neck: no JVD, carotid bruits, or masses Cardiac: RRR; no murmurs, rubs, or gallops,no edema  Respiratory:  clear to auscultation bilaterally, normal work of  breathing GI: soft, nontender, nondistended, + BS MS: no deformity or atrophy; absent right radial pulse; 2+ right ulnar pulse Skin: warm and dry, no rash Neuro:  Strength and sensation are intact Psych: euthymic mood, full affect   EKG:   The ekg ordered today demonstrates NSR, no ST segment changes   Recent Labs: No results found for requested labs within last 8760 hours.   Lipid Panel No results found for: CHOL, TRIG, HDL, CHOLHDL, VLDL, LDLCALC, LDLDIRECT   Other studies Reviewed: Additional studies/ records that were reviewed today with results demonstrating: Labs and imaging studies reviewed.   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 BP stable.  Continue to monitor blood pressure.  Palpable pulses on the left arm. Hyperlipidemia: LDL 143 in 2021.  Needs to get rechecked.  He will do with his PMD.  Whole food, plant-based diet recommended.  He does minimize meat intake. Cerebral aneurysm: Annual imaging in 03/2022.  Small aneurysm noted on prior imaging.   Current medicines are reviewed at length with the patient today.  The patient concerns regarding his medicines were addressed.  The following changes have been made:  No change  Labs/ tests ordered today include:  No orders of the defined types were placed in this encounter.   Recommend 150 minutes/week of aerobic exercise Low fat, low carb, high fiber diet recommended  Disposition:   FU in 1 year   Signed, 04/2022, MD  08/14/2021 8:32 AM    [facility] [address] [place], [place], [place]  [place] Phone: [phone]; Fax: [phone]

## 2021-08-14 ENCOUNTER — Ambulatory Visit: Payer: BC Managed Care – PPO | Admitting: Interventional Cardiology

## 2021-08-14 ENCOUNTER — Telehealth: Payer: Self-pay | Admitting: Physician Assistant

## 2021-08-14 ENCOUNTER — Other Ambulatory Visit: Payer: Self-pay

## 2021-08-14 ENCOUNTER — Encounter: Payer: Self-pay | Admitting: Interventional Cardiology

## 2021-08-14 VITALS — BP 112/70 | HR 58 | Ht 71.0 in | Wt 179.0 lb

## 2021-08-14 DIAGNOSIS — Q251 Coarctation of aorta: Secondary | ICD-10-CM

## 2021-08-14 DIAGNOSIS — E782 Mixed hyperlipidemia: Secondary | ICD-10-CM

## 2021-08-14 DIAGNOSIS — I671 Cerebral aneurysm, nonruptured: Secondary | ICD-10-CM

## 2021-08-14 NOTE — Patient Instructions (Signed)
Medication Instructions:  No changes  *If you need a refill on your cardiacmedications before your next appointment, please call your pharmacy*   Lab Work: None ordered If you have labs (blood work) drawn today and your tests are completely normal, you will receive your results only by: MyChart Message (if you have MyChart) OR A paper copy in the mail If you have any lab test that is abnormal or we need to change your treatment, we will call you to review the results.   Testing/Procedures: None ordered   Follow-Up: At [facility], you and your health needs are our priority.  As part of our continuing mission to provide you with exceptional heart care, we have created designated Provider Care Teams.  These Care Teams include your primary Cardiologist (physician) and Advanced Practice Providers (APPs -  Physician Assistants and Nurse Practitioners) who all work together to provide you with the care you need, when you need it.  We recommend signing up for the patient portal called "MyChart".  Sign up information is provided on this After Visit Summary.  MyChart is used to connect with patients for Virtual Visits (Telemedicine).  Patients are able to view lab/test results, encounter notes, upcoming appointments, etc.  Non-urgent messages can be sent to your provider as well.   To learn more about what you can do with MyChart, go to [email or web address].    Your next appointment:   12 month(s)  The format for your next appointment:   In Person  Provider:   Lance Muss, MD     Other Instructions None

## 2021-08-14 NOTE — Telephone Encounter (Signed)
Patient woke up this morning feeling unwell.  He had a nausea and vomited x1.  He is improving now.  COVID test was negative.  He denies chest pain, shortness of breath, fever or chills.  No sick contact.  I have recommended to keep his appointment this morning.

## 2022-04-10 ENCOUNTER — Other Ambulatory Visit: Payer: Self-pay | Admitting: *Deleted

## 2022-04-10 DIAGNOSIS — I671 Cerebral aneurysm, nonruptured: Secondary | ICD-10-CM

## 2022-04-10 DIAGNOSIS — Q251 Coarctation of aorta: Secondary | ICD-10-CM

## 2022-04-16 ENCOUNTER — Ambulatory Visit (HOSPITAL_COMMUNITY)
Admission: RE | Admit: 2022-04-16 | Discharge: 2022-04-16 | Disposition: A | Discharge status: Home / Self Care | Payer: BC Managed Care – PPO | Source: Ambulatory Visit | Attending: Interventional Cardiology | Admitting: Interventional Cardiology

## 2022-04-16 DIAGNOSIS — Q251 Coarctation of aorta: Secondary | ICD-10-CM | POA: Diagnosis present

## 2022-04-16 DIAGNOSIS — I671 Cerebral aneurysm, nonruptured: Secondary | ICD-10-CM | POA: Insufficient documentation

## 2022-04-23 ENCOUNTER — Other Ambulatory Visit: Payer: Self-pay | Admitting: *Deleted

## 2022-04-23 DIAGNOSIS — I671 Cerebral aneurysm, nonruptured: Secondary | ICD-10-CM

## 2022-08-18 NOTE — Progress Notes (Unsigned)
Cardiology Office Note   Date:  08/20/2022   ID:  Edgar Dean, DOB 12-16-1985, MRN [id number]  PCP:  Edgar Pepper, MD    No chief complaint on file.  Coarctation of the aorta  Wt Readings from Last 3 Encounters:  08/20/22 173 lb 3.2 oz (78.6 kg)  08/14/21 179 lb (81.2 kg)  03/11/20 174 lb 3.2 oz (79 kg)       History of Present Illness: Edgar Dean is a 37 y.o. male  who has a history of congenital Coarctation of the aorta repaired in 1989. 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   Records show: "He has had intermittent follow-up after coarctation repair when he was 57-1/38 years old. He has no complaints. He is physically active. He has relocated back to [place] where he was born and raised. He was diagnosed and treated by Dr. Theresia Dean. After graduating [employer], he has lived in [place] with his wife. He is establishing with cardiology to have routine follow-up. Last imaging was done in the [place] area at [facility], [place], [place], [place] [address]. He has a disc with a 2016 aortic MRI from that institution.                                                                          Aortic MRA performed in [place] March 2016 demonstrated aortic root of 3.1 cm, abnormal morphology of the large distal to the left subclavian ostium consistent with prior aortic coarctation repair.   Stress echo done April 2016 demonstrated 2 mm of inferolateral ST segment depression, upsloping, with normal LVEF during exercise and post exercise."   He has not been seen since 2018.   A few few months ago, he had some persistent chest in his chest and it resolved after a few days.  No recurrence.  Not related to activity.     He got his vaccines against COVID.  He does geriatric care management.   2022 MRA of the brain: "Possible small 2 mm outpouching involving  the anterior communicating artery complex, potentially reflecting a small aneurysm. Finding is stable as compared to 04/08/2020. Given patient history, continued annual surveillance is recommended. 2. Otherwise stable and negative intracranial MRA."   07/2021: "He has increased exercise- home rowing machine 4x/week. Occasional headaches with increased stress.  Relieved with Tylenol. "   04/2022 MRA: " Unchanged 2 mm outpouching arising from the anterior communicating artery complex, possibly reflecting a small aneurysm. 2. Otherwise stable and negative intracranial MRA."  Rare twinges in the chest, not related to exertion.  Walks regularly without cardiac sx.   Denies : exertional Chest pain. Dizziness. Leg edema. Nitroglycerin use. Orthopnea. Palpitations. Paroxysmal nocturnal dyspnea. Shortness of breath. Syncope.    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  Past Medical History:  Diagnosis Date   Bicuspid aortic valve 02/22/2017   Non-rheumatic mitral regurgitation 02/22/2017   Rheumatic valve narrowing and leaking 1986   leaking valve - tricupsid    Past Surgical History:  Procedure Laterality Date   APPENDECTOMY  2016   HERNIA REPAIR     10 grade  leaking valve - tricupsid (1986)       Current Outpatient Medications  Medication Sig Dispense Refill   amoxicillin (AMOXIL) 500 MG capsule Take 1,000 mg by mouth 2 (two) times daily.     ibuprofen (ADVIL) 200 MG tablet Take 200 mg by mouth every 8 (eight) hours as needed.     No current facility-administered medications for this visit.    Allergies:   Patient has no known allergies.    Social History:  The patient  reports that he has never smoked. He has never used smokeless tobacco. He reports current alcohol use. He reports that he does not use drugs.   Family History:  The patient's family history includes Healthy in his brother and father; Other in his mother.    ROS:  Please see the history of present  illness.   Otherwise, review of systems are positive for rare chest twinges.   All other systems are reviewed and negative.    PHYSICAL EXAM: VS:  BP 114/76   Pulse (!) 58   Ht 5\' 11"  (1.803 m)   Wt 173 lb 3.2 oz (78.6 kg)   SpO2 96%   BMI 24.16 kg/m  , BMI Body mass index is 24.16 kg/m. GEN: Well nourished, well developed, in no acute distress HEENT: normal Neck: no JVD, carotid bruits, or masses Cardiac: RRR; no murmurs, rubs, or gallops,no edema  Respiratory:  clear to auscultation bilaterally, normal work of breathing GI: soft, nontender, nondistended, + BS MS: no deformity or atrophy Skin: warm and dry, no rash Neuro:  Strength and sensation are intact Psych: euthymic mood, full affect   EKG:   The ekg ordered today demonstrates NSR, nonspecific ST changes   Recent Labs: No results found for requested labs within last 365 days.   Lipid Panel No results found for: "CHOL", "TRIG", "HDL", "CHOLHDL", "VLDL", "LDLCALC", "LDLDIRECT"   Other studies Reviewed: Additional studies/ records that were reviewed today with results demonstrating: LDL 134.   ASSESSMENT AND PLAN:  Coarctation of the aorta: Tricuspid aortic valve.  No signs of heart failure.  Last imaging study of his chest was in 2021.  Will plan every 5 years per his prior protocol.  Will plan for MRI of the chest in 2026.  Overall, he is feeling well.  Blood pressure well-controlled.  No signs of significant renarrowing of the coarctation repair.  I did ask him to check his blood pressure on occasion at home.  Chest twinges are worse with upper body movement.  More likely musculoskeletal.  No anginal symptoms. Hyperlipidemia: LDL 143 in 2021.  Whole food, plant-based diet recommended.  High-fiber diet.  Minimizing meat intake.  LDL 134 in 2023.  Plan for coronary calcium score CT.  This will help [place] determine whether or not statin therapy is indicated. Cerebral aneurysm: Stable, small aneurysm noted on MRA in 2023.   Will repeat in 2024.  Avoid heavy straining.   Current medicines are reviewed at length with the patient today.  The patient concerns regarding his medicines were addressed.  The following changes have been made:  No change  Labs/ tests ordered today include: calcium scoring test No orders of the defined types were placed in this encounter.   Recommend 150 minutes/week of aerobic exercise Low fat, low carb, high fiber diet recommended  Disposition:   FU in 1 year   Signed, Larae Grooms, MD  08/20/2022 8:24 AM    [facility] [place],  [place]  [address] Phone: [phone]; Fax: [phone]

## 2022-08-20 ENCOUNTER — Ambulatory Visit: Payer: BC Managed Care – PPO | Attending: Interventional Cardiology | Admitting: Interventional Cardiology

## 2022-08-20 ENCOUNTER — Encounter: Payer: Self-pay | Admitting: Interventional Cardiology

## 2022-08-20 VITALS — BP 114/76 | HR 58 | Ht 71.0 in | Wt 173.2 lb

## 2022-08-20 DIAGNOSIS — I671 Cerebral aneurysm, nonruptured: Secondary | ICD-10-CM

## 2022-08-20 DIAGNOSIS — Q251 Coarctation of aorta: Secondary | ICD-10-CM

## 2022-08-20 DIAGNOSIS — E782 Mixed hyperlipidemia: Secondary | ICD-10-CM | POA: Diagnosis not present

## 2022-08-20 NOTE — Patient Instructions (Signed)
Medication Instructions:  Your physician recommends that you continue on your current medications as directed. Please refer to the Current Medication list given to you today.  *If you need a refill on your cardiac medications before your next appointment, please call your pharmacy*   Lab Work: none If you have labs (blood work) drawn today and your tests are completely normal, you will receive your results only by: [place] (if you have MyChart) OR A paper copy in the mail If you have any lab test that is abnormal or we need to change your treatment, we will call you to review the results.   Testing/Procedures: Dr Irish Lack recommends you have a Calcium Score CT Scan.  MRA of head to be done in October for yearly follow up   Follow-Up: At [facility], you and your health needs are our priority.  As part of our continuing mission to provide you with exceptional heart care, we have created designated Provider Care Teams.  These Care Teams include your primary Cardiologist (physician) and Advanced Practice Providers (APPs -  Physician Assistants and Nurse Practitioners) who all work together to provide you with the care you need, when you need it.  We recommend signing up for the patient portal called "MyChart".  Sign up information is provided on this After Visit Summary.  MyChart is used to connect with patients for Virtual Visits (Telemedicine).  Patients are able to view lab/test results, encounter notes, upcoming appointments, etc.  Non-urgent messages can be sent to your provider as well.   To learn more about what you can do with MyChart, go to [email or web address].    Your next appointment:   12 month(s)  Provider:   Larae Grooms, MD     Other Instructions

## 2022-08-24 ENCOUNTER — Ambulatory Visit (HOSPITAL_COMMUNITY)
Admission: RE | Admit: 2022-08-24 | Discharge: 2022-08-24 | Disposition: A | Discharge status: Home / Self Care | Payer: BC Managed Care – PPO | Source: Ambulatory Visit | Attending: Interventional Cardiology | Admitting: Interventional Cardiology

## 2022-08-24 DIAGNOSIS — I671 Cerebral aneurysm, nonruptured: Secondary | ICD-10-CM | POA: Insufficient documentation

## 2022-08-24 DIAGNOSIS — Q251 Coarctation of aorta: Secondary | ICD-10-CM

## 2022-08-24 DIAGNOSIS — E782 Mixed hyperlipidemia: Secondary | ICD-10-CM | POA: Insufficient documentation

## 2023-04-16 ENCOUNTER — Ambulatory Visit (HOSPITAL_COMMUNITY): Payer: BC Managed Care – PPO
